# Patient Record
Sex: Female | Born: 1935
Health system: Southern US, Community
[De-identification: ages and names within clinical notes are randomized; demographics above are authoritative.]

## PROBLEM LIST (undated history)

## (undated) DIAGNOSIS — E785 Hyperlipidemia, unspecified: Secondary | ICD-10-CM

## (undated) DIAGNOSIS — M1990 Unspecified osteoarthritis, unspecified site: Secondary | ICD-10-CM

## (undated) DIAGNOSIS — K219 Gastro-esophageal reflux disease without esophagitis: Secondary | ICD-10-CM

## (undated) DIAGNOSIS — I10 Essential (primary) hypertension: Secondary | ICD-10-CM

## (undated) DIAGNOSIS — K222 Esophageal obstruction: Secondary | ICD-10-CM

## (undated) DIAGNOSIS — K589 Irritable bowel syndrome without diarrhea: Secondary | ICD-10-CM

## (undated) DIAGNOSIS — E119 Type 2 diabetes mellitus without complications: Secondary | ICD-10-CM

## (undated) DIAGNOSIS — K449 Diaphragmatic hernia without obstruction or gangrene: Secondary | ICD-10-CM

## (undated) HISTORY — DX: Hyperlipidemia, unspecified: E78.5

## (undated) HISTORY — DX: Diaphragmatic hernia without obstruction or gangrene: K44.9

## (undated) HISTORY — DX: Gastro-esophageal reflux disease without esophagitis: K21.9

## (undated) HISTORY — PX: COLONOSCOPY: SHX174

## (undated) HISTORY — DX: Irritable bowel syndrome, unspecified: K58.9

## (undated) HISTORY — PX: WRIST SURGERY: SHX841

## (undated) HISTORY — DX: Unspecified osteoarthritis, unspecified site: M19.90

## (undated) HISTORY — PX: TONSILLECTOMY: SUR1361

## (undated) HISTORY — DX: Essential (primary) hypertension: I10

## (undated) HISTORY — PX: ROTATOR CUFF REPAIR: SHX139

## (undated) HISTORY — DX: Type 2 diabetes mellitus without complications: E11.9

## (undated) HISTORY — PX: UPPER GASTROINTESTINAL ENDOSCOPY: SHX188

## (undated) HISTORY — DX: Esophageal obstruction: K22.2

---

## 1978-06-18 HISTORY — PX: TOTAL ABDOMINAL HYSTERECTOMY: SHX209

## 1998-08-17 ENCOUNTER — Other Ambulatory Visit: Admission: RE | Admit: 1998-08-17 | Discharge: 1998-08-17 | Payer: Self-pay | Admitting: *Deleted

## 1998-12-01 ENCOUNTER — Encounter: Payer: Self-pay | Admitting: *Deleted

## 1998-12-01 ENCOUNTER — Ambulatory Visit (HOSPITAL_COMMUNITY): Admission: RE | Admit: 1998-12-01 | Discharge: 1998-12-01 | Payer: Self-pay | Admitting: *Deleted

## 1999-12-05 ENCOUNTER — Ambulatory Visit (HOSPITAL_COMMUNITY): Admission: RE | Admit: 1999-12-05 | Discharge: 1999-12-05 | Payer: Self-pay | Admitting: *Deleted

## 1999-12-05 ENCOUNTER — Encounter: Payer: Self-pay | Admitting: *Deleted

## 2001-06-18 HISTORY — PX: VEIN LIGATION AND STRIPPING: SHX2653

## 2001-09-22 ENCOUNTER — Ambulatory Visit (HOSPITAL_COMMUNITY): Admission: RE | Admit: 2001-09-22 | Discharge: 2001-09-22 | Payer: Self-pay | Admitting: *Deleted

## 2001-09-22 ENCOUNTER — Encounter: Payer: Self-pay | Admitting: *Deleted

## 2001-10-08 ENCOUNTER — Other Ambulatory Visit: Admission: RE | Admit: 2001-10-08 | Discharge: 2001-10-08 | Payer: Self-pay | Admitting: *Deleted

## 2001-10-10 ENCOUNTER — Encounter: Payer: Self-pay | Admitting: *Deleted

## 2001-10-10 ENCOUNTER — Ambulatory Visit (HOSPITAL_COMMUNITY): Admission: RE | Admit: 2001-10-10 | Discharge: 2001-10-10 | Payer: Self-pay | Admitting: *Deleted

## 2001-12-01 ENCOUNTER — Encounter: Payer: Self-pay | Admitting: *Deleted

## 2001-12-03 ENCOUNTER — Ambulatory Visit (HOSPITAL_COMMUNITY): Admission: RE | Admit: 2001-12-03 | Discharge: 2001-12-04 | Payer: Self-pay | Admitting: *Deleted

## 2001-12-03 ENCOUNTER — Encounter (INDEPENDENT_AMBULATORY_CARE_PROVIDER_SITE_OTHER): Payer: Self-pay | Admitting: *Deleted

## 2002-11-26 ENCOUNTER — Encounter: Admission: RE | Admit: 2002-11-26 | Discharge: 2002-11-26 | Payer: Self-pay | Admitting: *Deleted

## 2002-11-26 ENCOUNTER — Encounter: Payer: Self-pay | Admitting: *Deleted

## 2004-11-27 ENCOUNTER — Encounter: Admission: RE | Admit: 2004-11-27 | Discharge: 2004-11-27 | Payer: Self-pay | Admitting: *Deleted

## 2004-12-04 ENCOUNTER — Encounter: Admission: RE | Admit: 2004-12-04 | Discharge: 2004-12-04 | Payer: Self-pay | Admitting: *Deleted

## 2006-02-15 ENCOUNTER — Encounter: Admission: RE | Admit: 2006-02-15 | Discharge: 2006-02-15 | Payer: Self-pay | Admitting: Internal Medicine

## 2006-06-06 ENCOUNTER — Encounter: Admission: RE | Admit: 2006-06-06 | Discharge: 2006-06-06 | Payer: Self-pay | Admitting: Internal Medicine

## 2006-09-08 ENCOUNTER — Emergency Department (HOSPITAL_COMMUNITY): Admission: EM | Admit: 2006-09-08 | Discharge: 2006-09-08 | Payer: Self-pay | Admitting: Emergency Medicine

## 2006-11-17 HISTORY — PX: KNEE ARTHROSCOPY: SHX127

## 2006-11-29 ENCOUNTER — Ambulatory Visit (HOSPITAL_COMMUNITY): Admission: RE | Admit: 2006-11-29 | Discharge: 2006-11-29 | Payer: Self-pay | Admitting: Orthopedic Surgery

## 2006-12-26 ENCOUNTER — Encounter: Admission: RE | Admit: 2006-12-26 | Discharge: 2007-03-26 | Payer: Self-pay | Admitting: Orthopedic Surgery

## 2007-11-13 ENCOUNTER — Encounter: Admission: RE | Admit: 2007-11-13 | Discharge: 2007-11-13 | Payer: Self-pay | Admitting: Internal Medicine

## 2008-07-28 ENCOUNTER — Telehealth (INDEPENDENT_AMBULATORY_CARE_PROVIDER_SITE_OTHER): Payer: Self-pay | Admitting: *Deleted

## 2008-07-29 ENCOUNTER — Encounter (INDEPENDENT_AMBULATORY_CARE_PROVIDER_SITE_OTHER): Payer: Self-pay | Admitting: *Deleted

## 2009-03-16 ENCOUNTER — Encounter: Admission: RE | Admit: 2009-03-16 | Discharge: 2009-03-16 | Payer: Self-pay | Admitting: Internal Medicine

## 2009-10-20 ENCOUNTER — Telehealth: Payer: Self-pay | Admitting: Internal Medicine

## 2010-03-20 ENCOUNTER — Encounter (INDEPENDENT_AMBULATORY_CARE_PROVIDER_SITE_OTHER): Payer: Self-pay | Admitting: *Deleted

## 2010-03-23 ENCOUNTER — Telehealth: Payer: Self-pay | Admitting: Internal Medicine

## 2010-03-27 ENCOUNTER — Encounter: Admission: RE | Admit: 2010-03-27 | Discharge: 2010-03-27 | Payer: Self-pay | Admitting: Internal Medicine

## 2010-03-31 ENCOUNTER — Encounter (INDEPENDENT_AMBULATORY_CARE_PROVIDER_SITE_OTHER): Payer: Self-pay | Admitting: *Deleted

## 2010-04-04 ENCOUNTER — Ambulatory Visit: Payer: Self-pay | Admitting: Internal Medicine

## 2010-04-10 ENCOUNTER — Ambulatory Visit: Payer: Self-pay | Admitting: Internal Medicine

## 2010-07-20 NOTE — Progress Notes (Signed)
Summary: Questions about pre-visit lettter  Phone Note Call from Patient Call back at Home Phone [phone]   Caller: Patient Call For: Dr. Juanda Chance Summary of Call: pt. has some questions about her pre-visit paperwork Initial call taken by: Karna Christmas,  March 23, 2010 9:28 AM  Follow-up for Phone Call        Patient would like to let us know that she did have some difficulty coming out of anesthesia for a general sugery. I have advised patient that typically, we use different sedatives than what would be used in a general surgery but that I would definately make the physician aware that she has had some problems before. I will let patient know if anything needs to be done differently, otherwise she is to keep her colonoscopy appointment as scheduled. Follow-up by: Lamona Curl CMA Duncan Dull),  March 23, 2010 12:33 PM  Additional Follow-up for Phone Call Additional follow up Details #1::        Left message with the pt that I agree with Dottie's assessment and that I will talk to the patient prior to the procedure so she can again raise her concerns. Additional Follow-up by: Hart Carwin MD,  March 24, 2010 4:07 PM

## 2010-07-20 NOTE — Letter (Signed)
Summary: Pre Visit Letter Revised  [facility]  [address] [place], [place] [address]   Phone: [phone]  Fax: [phone]        03/20/2010 MRN: [id number] [facility] [address] [place], [place]  [address]  [place]             Procedure Date:  04/10/2010  Welcome to the Gastroenterology Division at [facility].    You are scheduled to see a nurse for your pre-procedure visit on 04/04/2010 at 1:00pm on the 3rd floor at [facility], [address]  We ask that you try to arrive at our office 15 minutes prior to your appointment time to allow for check-in.  Please take a minute to review the attached form.  If you answer "Yes" to one or more of the questions on the first page, we ask that you call the person listed at your earliest opportunity.  If you answer "No" to all of the questions, please complete the rest of the form and bring it to your appointment.    Your nurse visit will consist of discussing your medical and surgical history, your immediate family medical history, and your medications.   If you are unable to list all of your medications on the form, please bring the medication bottles to your appointment and we will list them.  We will need to be aware of both prescribed and over the counter drugs.  We will need to know exact dosage information as well.    Please be prepared to read and sign documents such as consent forms, a financial agreement, and acknowledgement forms.  If necessary, and with your consent, a friend or relative is welcome to sit-in on the nurse visit with you.  Please bring your insurance card so that we may make a copy of it.  If your insurance requires a referral to see a specialist, please bring your referral form from your primary care physician.  No co-pay is required for this nurse visit.     If you cannot keep your appointment, please call [phone] to cancel or reschedule prior to your appointment date.   This allows [place] the opportunity to schedule an appointment for another patient in need of care.    Thank you for choosing  Gastroenterology for your medical needs.  We appreciate the opportunity to care for you.  Please visit [place] at our website  to learn more about our practice.  Sincerely, The Gastroenterology Division

## 2010-07-20 NOTE — Letter (Signed)
Summary: [facility] Instructions  [facility]  [address] [place], [place] [address]   Phone: [phone]  Fax: [phone]       Dominique Simmons    08/01/1933    MRN: [id number]        Procedure Day Dorna Bloom:  Duanne Limerick  04/10/10     Arrival Time:  3:00PM      Procedure Time:  4:00PM     Location of Procedure:                    _X _  [facility] (4th Floor)                       PREPARATION FOR COLONOSCOPY WITH MOVIPREP   Starting 5 days prior to your procedure 04/05/10 do not eat nuts, seeds, popcorn, corn, beans, peas,  salads, or any raw vegetables.  Do not take any fiber supplements (e.g. Metamucil, Citrucel, and Benefiber).  THE DAY BEFORE YOUR PROCEDURE         DATE: 04/09/10    DAY:  SUNDAY  1.  Drink clear liquids the entire day-NO SOLID FOOD  2.  Do not drink anything colored red or purple.  Avoid juices with pulp.  No orange juice.  3.  Drink at least 64 oz. (8 glasses) of fluid/clear liquids during the day to prevent dehydration and help the prep work efficiently.  CLEAR LIQUIDS INCLUDE: Water Jello Ice Popsicles Tea (sugar ok, no milk/cream) Powdered fruit flavored drinks Coffee (sugar ok, no milk/cream) Gatorade Juice: apple, white grape, white cranberry  Lemonade Clear bullion, consomm, broth Carbonated beverages (any kind) Strained chicken noodle soup Hard Candy                             4.  In the morning, mix first dose of MoviPrep solution:    Empty 1 Pouch A and 1 Pouch B into the disposable container    Add lukewarm drinking water to the top line of the container. Mix to dissolve    Refrigerate (mixed solution should be used within 24 hrs)  5.  Begin drinking the prep at 5:00 p.m. The MoviPrep container is divided by 4 marks.   Every 15 minutes drink the solution down to the next mark (approximately 8 oz) until the full liter is complete.   6.  Follow completed prep with 16 oz of clear liquid of your choice  (Nothing red or purple).  Continue to drink clear liquids until bedtime.  7.  Before going to bed, mix second dose of MoviPrep solution:    Empty 1 Pouch A and 1 Pouch B into the disposable container    Add lukewarm drinking water to the top line of the container. Mix to dissolve    Refrigerate  THE DAY OF YOUR PROCEDURE      DATE: 04/10/10  DAY:  MONDAY  Beginning at 11:00AM (5 hours before procedure):         1. Every 15 minutes, drink the solution down to the next mark (approx 8 oz) until the full liter is complete.  2. Follow completed prep with 16 oz. of clear liquid of your choice.    3. You may drink clear liquids until 2:00PM (2 HOURS BEFORE PROCEDURE).   MEDICATION INSTRUCTIONS  Unless otherwise instructed, you should take regular prescription medications with a small sip of water   as early  as possible the morning of your procedure.  Additional medication instructions: Hold HCTZ the morning of procedure.         OTHER INSTRUCTIONS  You will need a responsible adult at least 75 years of age to accompany you and drive you home.   This person must remain in the waiting room during your procedure.  Wear loose fitting clothing that is easily removed.  Leave jewelry and other valuables at home.  However, you may wish to bring a book to read or  an iPod/MP3 player to listen to music as you wait for your procedure to start.  Remove all body piercing jewelry and leave at home.  Total time from sign-in until discharge is approximately 2-3 hours.  You should go home directly after your procedure and rest.  You can resume normal activities the  day after your procedure.  The day of your procedure you should not:   Drive   Make legal decisions   Operate machinery   Drink alcohol   Return to work  You will receive specific instructions about eating, activities and medications before you leave.    The above instructions have been reviewed and explained to  me by   Wyona Almas RN  April 04, 2010 1:29 PM    I fully understand and can verbalize these instructions _____________________________ Date _________

## 2010-07-20 NOTE — Miscellaneous (Signed)
Summary: [facility] Previsit/prep  Clinical Lists Changes  Medications: Added new medication of MOVIPREP 100 GM  SOLR (PEG-KCL-NACL-NASULF-NA ASC-C) As per prep instructions. - Signed Rx of MOVIPREP 100 GM  SOLR (PEG-KCL-NACL-NASULF-NA ASC-C) As per prep instructions.;  #1 x 0;  Signed;  Entered by: Wyona Almas RN;  Authorized by: Hart Carwin MD;  Method used: Electronically to [facility] [address] [place], [place], [place]  [address], Ph: [phone], Fax: [phone] Allergies: Added new allergy or adverse reaction of AMOXICILLIN Observations: Added new observation of NKA: F (04/04/2010 12:51)    Prescriptions: MOVIPREP 100 GM  SOLR (PEG-KCL-NACL-NASULF-NA ASC-C) As per prep instructions.  #1 x 0   Entered by:   Wyona Almas RN   Authorized by:   Hart Carwin MD   Signed by:   Wyona Almas RN on 04/04/2010   Method used:   Electronically to        [facility] (retail)       [address]       [place], [place]  [address]       Ph: [phone]       Fax: [phone]   RxID:   [id number]

## 2010-07-20 NOTE — Progress Notes (Signed)
Summary: Schedule Colonoscopy  Phone Note Outgoing Call Call back at [facility] Phone [phone]   Call placed by: Harlow Mares CMA Duncan Dull),  Oct 20, 2009 12:42 PM Call placed to: Patient Summary of Call: spoke to patients husband and gave him our phone number and information to have the patient call back. He will have the patient call back to schedule when she gets home. She is due for a recall colonoscopy Initial call taken by: Harlow Mares CMA Duncan Dull),  Oct 20, 2009 12:43 PM

## 2010-07-20 NOTE — Procedures (Signed)
Summary: Colonoscopy  Patient: [name] Note: All result statuses are Final unless otherwise noted.  Tests: (1) Colonoscopy (COL)   COL Colonoscopy           DONE     [facility]     [address]     [place], [place]  [address]           COLONOSCOPY PROCEDURE REPORT           PATIENT:  Dominique, Simmons  MR#:  [id number]     BIRTHDATE:  03-05-1934, 76 yrs. old  GENDER:  female     ENDOSCOPIST:  Hedwig Morton. Juanda Chance, MD     REF. BY:  Georgianne Fick, M.D.     PROCEDURE DATE:  04/10/2010     PROCEDURE:  Colonoscopy 19147     ASA CLASS:  Class I     INDICATIONS:  Routine Risk Screening last colon 2000 was normal     MEDICATIONS:   Versed 8 mg, Fentanyl 75 mcg           DESCRIPTION OF PROCEDURE:   After the risks benefits and     alternatives of the procedure were thoroughly explained, informed     consent was obtained.  Digital rectal exam was performed and     revealed no rectal masses.   The LB CF-H180AL [id number] endoscope     was introduced through the anus and advanced to the cecum, which     was identified by both the appendix and ileocecal valve, without     limitations.  The quality of the prep was good, using MoviPrep.     The instrument was then slowly withdrawn as the colon was fully     examined.     <<PROCEDUREIMAGES>>           FINDINGS:  No polyps or cancers were seen (see image1, image2,     image3, image4, and image5).   Retroflexed views in the rectum     revealed no abnormalities.    The scope was then withdrawn from     the patient and the procedure completed.           COMPLICATIONS:  None     ENDOSCOPIC IMPRESSION:     1) No polyps or cancers     2) Normal colonoscopy     RECOMMENDATIONS:     1) high fiber diet     REPEAT EXAM:  In 0 year(s) for.  no recall due to age of 47 at     time of her recall           ______________________________     Hedwig Morton. Juanda Chance, MD           CC:           n.     eSIGNED:   Hedwig Morton. Brodie at 04/10/2010 04:40  PM           Anders Simmonds, [id number]  Note: An exclamation mark (!) indicates a result that was not dispersed into the flowsheet. Document Creation Date: 04/10/2010 4:40 PM _______________________________________________________________________  (1) Order result status: Final Collection or observation date-time: 04/10/2010 16:31 Requested date-time:  Receipt date-time:  Reported date-time:  Referring Physician:   Ordering Physician: Lina Sar [phone]) Specimen Source:  Source: Launa Grill Order Number: [id number] Lab site:

## 2010-10-31 NOTE — Op Note (Signed)
Dominique Simmons, Dominique Simmons               ACCOUNT NO.:  [id number]   MEDICAL RECORD NO.:  [id number]          PATIENT TYPE:  AMB   LOCATION:  DAY                          FACILITY:  [facility]   PHYSICIAN:  Ollen Gross, M.D.    DATE OF BIRTH:  [birth year]   DATE OF PROCEDURE:  11/29/2006  DATE OF DISCHARGE:                               OPERATIVE REPORT   PREOPERATIVE DIAGNOSIS:  Right knee medial lateral meniscal tears.   POSTOPERATIVE DIAGNOSIS:  Right knee medial lateral meniscal tears plus  chondral defect medial.   PROCEDURE:  Right knee arthroscopy with medial and lateral meniscal  debridement and chondroplasty, medial.   SURGEON:  Ollen Gross, M.D.   ASSISTANT:  No assistant.   ANESTHESIA:  General.   BLOOD LOSS:  Minimal.   DRAINS:  None.   COMPLICATIONS:  None.   CONDITION:  Stable to recovery.   BRIEF CLINICAL NOTE:  The patient is a 77 year old female with  significant right knee pain and mechanical symptoms.  Exam and history  consistent with meniscal tear as confirmed by MRI.  Presents now for  arthroscopy and debridement.   PROCEDURE IN DETAIL:  After successful administration of general  anesthetic, a tourniquet placed on the right thigh.  The right lower  extremity was prepped and draped in usual sterile fashion.  Standard  superomedial and inferolateral portal incision was made.  In-flow  cannula passed superomedial and camera passed inferolateral.  Arthroscopic visualization proceeds.  Undersurface of the patella and  trochlea looked normal.  A fair amount of synovitis present superiorly.  Medial and lateral gutters were visualized and there were no loose  bodies.  Flexion valgus force applied to the knee and the medial  compartment was entered.  She has extensive Grade II and III change on  the medial femoral condyle, and to a lesser extent on the medial tibial  plateau.  There is a tear in the body and posterior horn of the medial  meniscus.  Spinal needle  is used to localize the inferomedial portal.  Small incision made, dilator placed and then we debrided the meniscus  back to a stable base with baskets and a 4.2 mm shaver.  On the femoral  condyle, the unstable cartilage is debrided back to stable cartilaginous  base.  There was no exposed bone.  The cartilage was extremely thin.  On  the medial tibial plateau is a small area where we did the same.  The  medial compartment looked much better post debridement.  The  intercondylar notch is visualized, ACL was normal.  Lateral compartment  was entered.  She has a terrible tear on the anterior horn of the  lateral meniscus.  This is flipped into the joint.  This is debrided  back to stable base with baskets and 4.2 mm shaver and sealed off with  the ArthroCare device.  The chondral surfaces of the lateral compartment  have low grade chondromalacia.  The joint again inspected.  No further  tears or loose bodies.  Arthroscope was removed from the inferior  portals  which were  closed with  interrupted 4-0 nylon.  20 cc of 0.25% Marcaine with epi  injected through the in-flow cannula and that is removed and that portal  closed with nylon.  Bulky sterile dressings applied.  She is awakened  and transported to recovery in stable condition.      Ollen Gross, M.D.  Electronically Signed     FA/MEDQ  D:  11/29/2006  T:  11/29/2006  Job:  [id number]

## 2010-11-03 NOTE — Op Note (Signed)
[place]  Patient:    JOUA, BAKE Visit Number: [id number] MRN: [id number]          Service Type: DSU Location: [id number] Attending Physician:  Melvenia Needles Dictated by:   Denman George, M.D. Proc. Date: 12/03/01 Admit Date:  12/03/2001 Discharge Date: 12/04/2001                             Operative Report  SURGEON:  Denman George, M.D.  ASSISTANT:  Loura Pardon, P.A.  ANESTHETIC:  General endotracheal.  ANESTHESIOLOGIST:  PREOPERATIVE DIAGNOSES: 1. Bilateral greater saphenous vein incompetence. 2. Bilateral lower extremity varicose veins.  POSTOPERATIVE DIAGNOSES: 1. Bilateral greater saphenous vein incompetence. 2. Bilateral lower extremity varicose veins.  PROCEDURES: 1. Stripping of right greater saphenous vein. 2. High ligation of left greater saphenous vein. 3. Stripping and ligation of multiple venous varicosities.  CLINICAL NOTE:  This is a 77 year old female with recurrent varicose veins involving the lower extremities bilaterally.  She has previously had a ligation procedure along with ______ therapy injection.  She now has evidence by Doppler of bilateral greater saphenous vein incompetence and multiple varicosities bilaterally.  She has aching discomfort and swelling.  She is brought to the operating room at this time for bilateral lower extremity varicose vein stripping and ligation.  OPERATIVE PROCEDURE:  The patient was brought to the operating room in stable condition.  Placed in the supine position.  Both legs were prepped and draped in a sterile fashion.  Bilateral oblique groin skin incisions were made over the saphenofemoral junction.  Dissection was carried down bilaterally to expose the saphenofemoral junction.  Tributaries of the saphenous vein was ligated with 3-0 silk and divided.  In the left groin, a high ligation of the greater saphenous vein was carried out with 2-0 silk  tie.  In the right groin, the greater saphenous vein revealed marked varicose changes.  It was ligated at the saphenofemoral junction with 2-0 silk tie and encircled distally with 2-0 silk tie.  A second skin incision was then made in the right leg, the proximal calf over the saphenous vein.  The saphenous vein was exposed and ligated distally with 2-0 silk tie.  Divided, and the stripper could be passed to the mid thigh.  A counter incision was made at the stripper site in the mid thigh and the stripper was brought through and the distal portion of the saphenous vein was stripped.  A second stripper was then passed retrograde down the vein and the more proximal segment of the greater saphenous vein in the right thigh was removed.  Bilateral transverse stepladder skin incisions were then made over premarked sites and multiple varicosities were stripped through these and ligated with 2-0 and 3-0 silk.  The wound was then irrigated with saline solution.  The groin wounds were closed with running 3-0 Vicryl suture for subcutaneous layer and 4-0 subcuticular Vicryl for skin.  The small incisions in the leg were closed with a single layer of dermal 4-0 interrupted Vicryl.  Half-inch Steri-Strips were placed over these.  The patient tolerated the procedure well and was transferred to the recovery room with application of dressings of 4 x 4s, Kerlix, and Ace wrap. Dictated by:   Denman George, M.D. Attending Physician:  Melvenia Needles DD:  12/03/01 TD:  12/04/01 Job: [id number]

## 2010-11-03 NOTE — Discharge Summary (Signed)
[place]  Patient:    Dominique Simmons, Dominique Simmons Visit Number: [id number] MRN: [id number]          Service Type: DSU Location: [id number] Attending Physician:  Melvenia Needles Dictated by:   Loura Pardon, P.A. Admit Date:  12/03/2001 Discharge Date: 12/04/2001   CC:         Andres Ege, M.D. [address] [place], [place]                           Discharge Summary  DATE OF BIRTH:  26-Aug-1933  DISCHARGE DIAGNOSES:  Bilateral lower extremity varicose veins, progressive, painful.  SECONDARY DIAGNOSES 1. Hypercholesterolemia. 2. Irritable bowel syndrome. 3. Status post total abdominal hysterectomy. 4. Status post tendon repair of left wrist. 5. Status post left shoulder surgery secondary to adhesive capsulitis. 7. Status post tonsillectomy.  DISCHARGE DISPOSITION:  The patient is ready for discharge on postoperative day #1 after undergoing ligature and stripping of varicose veins in the lower extremities, including stripping of the greater saphenous vein in the right thigh and ligation of the greater saphenous vein in the left thigh. The patient has been afebrile in the postoperative period. Her vital signs have been stable. Her pain is controlled well with oral Percocet. She is taking oral nourishment well and tolerating it. Her incisions are showing no evidence of erythema, swelling or drainage. She is able to ambulate independently, although she has been maintained on bed rest for the majority of the day of surgery. Her mental status has remained clear, and she is ready for discharge on the morning of postoperative day #1.  DISCHARGE MEDICATIONS:  Percocet 5/325 1 to 2 tabs p.o. q.4-6h. p.r.n. pain.  DISCHARGE INSTRUCTIONS:  Activity, she may begin walking Thursday, December 04, 2001. She is asked to keep her legs elevated on pillows when in bed or sitting in a chair. She may remove the bandages to the lower  extremities on the evening of Thursday, December 04, 2001. She is to replace the Ace bandages only on both legs, Friday morning, December 05, 2001. She may begin showering on Saturday, December 06, 2001. Her discharge diet is a regular diet without restrictions.  FOLLOWUP:  She will see Dr. Madilyn Fireman in the office for followup Monday, December 08, 2001, at 11:30 in the morning.  CONDITION ON DISCHARGE:  Improved.  BRIEF HISTORY:  The patient is a 77 year old female. She underwent local varicose vein surgery in 1998 by Dr. Lebron Conners. Following the surgery she did develop a hematoma in the right leg which required postoperative evacuation. She complains now of discomfort in the right leg associated with progressive varicose veins. She notes that they are aching, heavy and cause fatigue. The varicosities on the left leg are symptomatic, but to a lesser degree. She has no history of phlebitis or prior anticoagulant therapy.  On Nov 13, 2001, she underwent Doppler venous examination of the left and right lower extremities. The study showed no evidence of deep venous thrombosis bilaterally. The study does show incompetence of the right saphenofemoral junction, incompetence in the right greater saphenous vein and also in the lesser saphenous vein in the midcalf to ankle. She also has incompetence of the left greater saphenous vein beginning in the upper thigh with incompetent branches to the lateral popliteal fossa. Also it was found old superficial thrombophlebitis in a varicose vein at the medial aspect of the right  knee. She presents on December 03, 2001, for ligature and stripping of varicose veins bilaterally.  HOSPITAL COURSE:  As described in the discharge disposition. The patient was discharged on postoperative day #1 in stable and satisfactory condition with followup with Dr. Madilyn Fireman on Monday, December 08, 2001.          Dictated by:   Loura Pardon, P.A. Attending Physician:  Melvenia Needles DD:   12/03/01 TD:  12/04/01 Job: [id number]

## 2010-11-03 NOTE — H&P (Signed)
[place]  Patient:    Dominique Simmons, KEYS Visit Number: [id number] MRN: [id number]          Service Type: DSU Location: [phone] Attending Physician:  Melvenia Needles Dictated by:   Adair Patter, P.A. Admit Date:  12/03/2001                           History and Physical  CHIEF COMPLAINT:  Bilateral varicose veins.  HISTORY OF PRESENT ILLNESS:  This is a 77 year old female who has had varicose veins for almost her entire life.  She said she has had trouble with them for most of her life but recently these varicose veins have become painful and have significantly affected her lifestyle.  She denies any nonhealing ulcers but says that these varicose veins cause pain that is almost constant in nature and is preventing her from progressing with her activities of daily living.  PAST MEDICAL HISTORY: 1. Hypercholesterolemia. 2. Irritable bowel syndrome.  PAST SURGICAL HISTORY: 1. Total abdominal hysterectomy. 2. Tendon repair of the left wrist. 3. Left shoulder surgery secondary to adhesive capsulitis. 4. Tonsillectomy.  ALLERGIES:  AMOXICILLIN, which causes hives.  MEDICATIONS:  The patient currently takes no medication.  FAMILY HISTORY:  The patients mother and father are deceased.  They both had coronary artery disease.  She has said she has an extensive family history of multiple types of cancer.  She has a sister who is living who has coronary artery disease and had a myocardial infarction.  She has a brother who has diabetes mellitus.  She denies any family history of hypertension or cerebrovascular accident.  SOCIAL HISTORY:  The patient is married and lives with her husband.  Has two children.  She denies any alcohol use and has never smoked cigarettes.  REVIEW OF SYSTEMS:  GENERAL:  The patient denies any fever, chills, night sweats, or frequent illnesses.  HEAD:  The patient denies any severe head injuries or loss of  consciousness.  EYES:  The patient denies any glaucoma or cataracts.  EARS:  The patient denies any tinnitus, vertigo, hearing loss, or ear infections.  NOSE:  The patient denies any epistaxis, rhinorrhea, or sinusitis.  MOUTH:  The patient denies any problems with dentition or frequent sore throats.  NECK:  The patient denies any lumps, masses, or pain on range of motion of her neck.  CARDIOVASCULAR:  The patient denies any hypertension, cardiac arrhythmias, or myocardial infarction.  PULMONARY:  The patient denies any asthma, bronchitis, emphysema, or pneumonia.  GASTROINTESTINAL:  The patient has irritable bowel syndrome but does not currently take any treatment for that.  She denies any gastroesophageal reflux disease.  GENITOURINARY:  Tp denies any urinary tract infections or incontinence.  ENDOCRINE:  The patient denies any diabetes mellitus or thyroid disease.  MUSCULOSKELETAL:  The patient denies any arthritis, arthralgias, or myalgias.  NEUROLOGIC:  The patient denies any memory loss, depression, stroke, or transient ischemic attack.  PHYSICAL EXAMINATION:  VITAL SIGNS:  Blood pressure is 120/80, pulse is 84 and regular, respirations are 16.  GENERAL:  The patient is alert and oriented x3, is in no distress.  HEENT:  Head is atraumatic, normocephalic.  Eyes:  The pupils equal, round and reactive to light and accommodation, extraocular movements are intact, without scleral icterus or nystagmus.  Ears:  Auditory acuity is grossly intact. Noses:  The nares are patent and intact.  Sinuses are nontender.  Mouth is moist without exudates.  NECK:  Supple without JVD, lymphadenopathy, carotid bruits, or thyromegaly.  CHEST:  Bilaterally clear to auscultation, without rhonchi or wheezes.  CARDIAC:  Regular rate and rhythm without murmurs, gallops, or rubs.  ABDOMEN:  Soft, nontender, nondistended.  Positive bowel sounds in all four quadrants.  EXTREMITIES:  No cyanosis,  clubbing, or edema.  Her feet were warm to touch. She had multiple varicose veins present in her lower extremities.  VASCULAR:  Peripheral pulse exam revealed 2+ carotid, femoral, and 1+ dorsalis pedis pulses bilaterally.  NEUROLOGIC:  Cranial nerves II-XII were grossly intact.  Her gait was steady without the use of any assistive devices.  She had 5+ and equal muscle strength in all four extremities.  IMPRESSION:  Bilateral varicose veins.  PLAN:  The patient will undergo varicose vein stripping of the bilateral lower extremities. Dictated by:   Adair Patter, P.A. Attending Physician:  Melvenia Needles DD:  12/01/01 TD:  12/02/01 Job: [id number]

## 2011-04-05 LAB — BASIC METABOLIC PANEL
CO2: 29
Creatinine, Ser: 0.81
GFR calc Af Amer: >60
Glucose, Bld: 135 — ABNORMAL HIGH
Potassium: 3.4 — ABNORMAL LOW
Sodium: 140

## 2011-04-05 LAB — CBC
HCT: 42.3
MCHC: 34.8
Platelets: 210
RBC: 4.67
RDW: 12.4

## 2011-04-05 LAB — URINALYSIS, ROUTINE W REFLEX MICROSCOPIC
Bilirubin Urine: NEGATIVE
Ketones, ur: NEGATIVE
Nitrite: NEGATIVE
Protein, ur: NEGATIVE
Urobilinogen, UA: 0.2
pH: 7

## 2011-04-05 LAB — URINE MICROSCOPIC-ADD ON

## 2011-09-12 ENCOUNTER — Ambulatory Visit
Admission: RE | Admit: 2011-09-12 | Discharge: 2011-09-12 | Disposition: A | Discharge status: Home / Self Care | Payer: Medicare Other | Source: Ambulatory Visit | Attending: Internal Medicine | Admitting: Internal Medicine

## 2011-09-12 ENCOUNTER — Other Ambulatory Visit: Payer: Self-pay | Admitting: Internal Medicine

## 2011-09-12 DIAGNOSIS — R29810 Facial weakness: Secondary | ICD-10-CM

## 2011-11-17 DIAGNOSIS — K222 Esophageal obstruction: Secondary | ICD-10-CM

## 2011-11-17 DIAGNOSIS — K449 Diaphragmatic hernia without obstruction or gangrene: Secondary | ICD-10-CM

## 2011-11-17 HISTORY — DX: Diaphragmatic hernia without obstruction or gangrene: K44.9

## 2011-11-17 HISTORY — DX: Esophageal obstruction: K22.2

## 2011-11-30 ENCOUNTER — Other Ambulatory Visit: Payer: Self-pay | Admitting: Internal Medicine

## 2011-11-30 DIAGNOSIS — Z1231 Encounter for screening mammogram for malignant neoplasm of breast: Secondary | ICD-10-CM

## 2011-12-05 ENCOUNTER — Other Ambulatory Visit: Payer: Self-pay | Admitting: Otolaryngology

## 2011-12-05 DIAGNOSIS — R1310 Dysphagia, unspecified: Secondary | ICD-10-CM

## 2011-12-11 ENCOUNTER — Ambulatory Visit
Admission: RE | Admit: 2011-12-11 | Discharge: 2011-12-11 | Disposition: A | Discharge status: Home / Self Care | Payer: Medicare Other | Source: Ambulatory Visit | Attending: Otolaryngology | Admitting: Otolaryngology

## 2011-12-11 DIAGNOSIS — R1310 Dysphagia, unspecified: Secondary | ICD-10-CM

## 2011-12-25 ENCOUNTER — Encounter: Payer: Self-pay | Admitting: Internal Medicine

## 2011-12-26 ENCOUNTER — Ambulatory Visit
Admission: RE | Admit: 2011-12-26 | Discharge: 2011-12-26 | Disposition: A | Discharge status: Home / Self Care | Payer: Medicare Other | Source: Ambulatory Visit | Attending: Internal Medicine | Admitting: Internal Medicine

## 2011-12-26 DIAGNOSIS — Z1231 Encounter for screening mammogram for malignant neoplasm of breast: Secondary | ICD-10-CM

## 2012-01-22 ENCOUNTER — Encounter: Payer: Self-pay | Admitting: *Deleted

## 2012-01-23 ENCOUNTER — Encounter: Payer: Self-pay | Admitting: *Deleted

## 2012-02-05 ENCOUNTER — Ambulatory Visit (INDEPENDENT_AMBULATORY_CARE_PROVIDER_SITE_OTHER): Payer: BC Managed Care – PPO | Admitting: Internal Medicine

## 2012-02-05 ENCOUNTER — Encounter: Payer: Self-pay | Admitting: Internal Medicine

## 2012-02-05 VITALS — BP 110/60 | HR 68 | Ht 66.25 in | Wt 172.2 lb

## 2012-02-05 DIAGNOSIS — Q393 Congenital stenosis and stricture of esophagus: Secondary | ICD-10-CM

## 2012-02-05 DIAGNOSIS — R1319 Other dysphagia: Secondary | ICD-10-CM

## 2012-02-05 DIAGNOSIS — K222 Esophageal obstruction: Secondary | ICD-10-CM

## 2012-02-05 NOTE — Patient Instructions (Addendum)
You have been scheduled for an endoscopy with propofol. Please follow written instructions given to you at your visit today. If you use inhalers (even only as needed), please bring them with you on the day of your procedure. CC: Dr Georgianne Fick, Dr Brynda Peon

## 2012-02-05 NOTE — Progress Notes (Signed)
Dominique Simmons 07-24-1933 MRN [id number]   History of Present Illness:  This is a 78 year old white female with episodes of solid food dysphagia and food regurgitation. A recent episode in a restaurant was a very scary for her. She was unable to swallow her saliva for several hours and ultimately regurgitated all the retained food back. This has been occurring for several years intermittently. Her barium swallow recently showed a 8-9 mm narrowing in the distal esophagus which was consistent with a Schatzki's ring. A 13 mm barium tablet did not pass. There was a small hiatal hernia and normal esophageal motility. We have seen Ms.Topping in the past for a colorectal screening. She had a normal flexible sigmoidoscopy in 1990 and 1997. She had also a normal colonoscopy in March 2000 and in October 2011.   Past Medical History  Diagnosis Date  . Sliding hiatal hernia 11/2011  . Esophageal ring 11/2011  . IBS (irritable bowel syndrome)   . Hyperlipidemia   . GERD (gastroesophageal reflux disease)   . Arthritis   . Hypertension    Past Surgical History  Procedure Date  . Total abdominal hysterectomy 1980  . Wrist surgery     left  . Rotator cuff repair     left  . Tonsillectomy   . Vein ligation and stripping 2003    bilateral  . Knee arthroscopy 11/2006    right    reports that she has never smoked. She has never used smokeless tobacco. She reports that she does not drink alcohol or use illicit drugs. family history includes Coronary artery disease in her father, mother, and sister; Diabetes in her brother; and Heart attack in her sister. Allergies  Allergen Reactions  . Amoxicillin     REACTION: rash        Review of Systems: Positive for solid food dysphagia. Denies odynophagia. Shortness of breath change in bowel habits  The remainder of the 10 point ROS is negative except as outlined in H&P   Physical Exam: General appearance  Well developed, in no distress. Eyes- non  icteric. HEENT nontraumatic, normocephalic. Mouth no lesions, tongue papillated, no cheilosis normal voice. Neck supple without adenopathy, thyroid not enlarged, no carotid bruits, no JVD. Lungs Clear to auscultation bilaterally. Cor normal S1, normal S2, regular rhythm, no murmur,  quiet precordium. Abdomen: Soft nontender abdomen with normoactive bowel sounds. Rectal: Not done. Extremities no pedal edema. Skin no lesions. Neurological alert and oriented x 3. Psychological normal mood and affect.  Assessment and Plan:  Problem #79 78 year old white female with a Schatzki's ring on a barium esophagram which I have personally reviewed via DVD. The stricture appears benign. It has a maximum diameter 9 mm. Her motility is normal. She will be scheduled for an upper endoscopy and esophageal dilation. She does not give any history of symptomatic gastroesophageal reflux. This stricture is probably not caused by acid reflux.  Problem #2 Colorectal screening. She is up-to-date on her colonoscopy. Her next exam is due in October 2021. However, it probably will not be necessary because of her age at that time.   02/05/2012 Lina Sar

## 2012-02-07 ENCOUNTER — Encounter: Payer: Self-pay | Admitting: Internal Medicine

## 2012-02-07 ENCOUNTER — Ambulatory Visit (AMBULATORY_SURGERY_CENTER): Payer: BC Managed Care – PPO | Admitting: Internal Medicine

## 2012-02-07 VITALS — BP 136/65 | HR 72 | Temp 97.9°F | Resp 16 | Ht 66.25 in | Wt 172.0 lb

## 2012-02-07 DIAGNOSIS — R1319 Other dysphagia: Secondary | ICD-10-CM

## 2012-02-07 DIAGNOSIS — K222 Esophageal obstruction: Secondary | ICD-10-CM

## 2012-02-07 MED ORDER — SODIUM CHLORIDE 0.9 % IV SOLN: 500.0000 mL | INTRAVENOUS | Status: DC

## 2012-02-07 NOTE — Progress Notes (Signed)
Patient did not experience any of the following events: a burn prior to discharge; a fall within the facility; wrong site/side/patient/procedure/implant event; or a hospital transfer or hospital admission upon discharge from the facility. (G8907) Patient did not have preoperative order for IV antibiotic SSI prophylaxis. (G8918)  

## 2012-02-07 NOTE — Op Note (Signed)
[facility] [address] [place], [address]   ENDOSCOPY PROCEDURE REPORT  PATIENT: Dominique Simmons, Dominique Simmons  MR#: [id number] BIRTHDATE: 03/29/1934 , 78  yrs. old GENDER: Female ENDOSCOPIST: Hart Carwin, MD REFERRED BY:  Georgianne Fick, M.D. PROCEDURE DATE:  02/07/2012 PROCEDURE:  EGD w/ dilation of esophagus via guidewire ASA CLASS:     Class II INDICATIONS:  dysphagia.   ba rium esophagram showa distal es. stricture, 13 mm tablet could not pass through. MEDICATIONS: MAC sedation, administered by CRNA and Propofol (Diprivan) 130 mg IV TOPICAL ANESTHETIC: Cetacaine Spray  DESCRIPTION OF PROCEDURE: After the risks benefits and alternatives of the procedure were thoroughly explained, informed consent was obtained.  The LB-GIF Q180 [id number] endoscope was introduced through the mouth and advanced to the second portion of the duodenum. Without limitations.  The instrument was slowly withdrawn as the mucosa was fully examined.      ESOPHAGUS: A stricture was found at the gastroesophageal junction. The stenosis was traversable with the endoscope.   A 3 cm hiatal hernia was noted.  Benign appearing distal esophageal stricture less than 9 mm, traversed with resistance, there was bleeding from the stricture stricture dilated with Savary dilators 12 mm and 13 mm with samll amount of blood on the dilator.   Non-reducible hernia, 32-35 cm. Retroflexed views revealed no abnormalities.     The scope was then withdrawn from the patient and the procedure completed.  COMPLICATIONS: There were no complications. ENDOSCOPIC IMPRESSION: 1.   Stricture was found at the gastroesophageal junction 2.   3 cm hiatal hernia 3.   Benign appearing distal esophageal stricture less than 9 mm, traversed with resistance, there was bleeding from the stricture stricture dilated with Savary dilators 12 mm and 13 mm with samll amount of blood on the dilator 4.   Non-reducible hernia, 32-35  cm  RECOMMENDATIONS: 1.  Anti-reflux regimen to be follow 2.  continue current meds  REPEAT EXAM: In 4 week(s)  for EGD with redilatation,. due to severity of the stricture  eSigned:  Hart Carwin, MD 02/07/2012 11:43 AM   CC:

## 2012-02-07 NOTE — Patient Instructions (Addendum)
YOU HAD AN ENDOSCOPIC PROCEDURE TODAY AT THE [facility]: Refer to the procedure report that was given to you for any specific questions about what was found during the examination.  If the procedure report does not answer your questions, please call your gastroenterologist to clarify.  If you requested that your care partner not be given the details of your procedure findings, then the procedure report has been included in a sealed envelope for you to review at your convenience later.  YOU SHOULD EXPECT: Some feelings of bloating in the abdomen. Passage of more gas than usual.  Walking can help get rid of the air that was put into your GI tract during the procedure and reduce the bloating. If you had a lower endoscopy (such as a colonoscopy or flexible sigmoidoscopy) you may notice spotting of blood in your stool or on the toilet paper. If you underwent a bowel prep for your procedure, then you may not have a normal bowel movement for a few days.  DIET: FOLLOW DILATION DIET! Drink plenty of fluids but you should avoid alcoholic beverages for 24 hours.  ACTIVITY: Your care partner should take you home directly after the procedure.  You should plan to take it easy, moving slowly for the rest of the day.  You can resume normal activity the day after the procedure however you should NOT DRIVE or use heavy machinery for 24 hours (because of the sedation medicines used during the test).    SYMPTOMS TO REPORT IMMEDIATELY: A gastroenterologist can be reached at any hour.  During normal business hours, 8:30 AM to 5:00 PM Monday through Friday, call [phone].  After hours and on weekends, please call the GI answering service at [phone] who will take a message and have the physician on call contact you.   Following upper endoscopy (EGD)  Vomiting of blood or coffee ground material  New chest pain or pain under the shoulder blades  Painful or persistently difficult swallowing  New  shortness of breath  Fever of 100F or higher  Black, tarry-looking stools  FOLLOW UP: If any biopsies were taken you will be contacted by phone or by letter within the next 1-3 weeks.  Call your gastroenterologist if you have not heard about the biopsies in 3 weeks.  Our staff will call the home number listed on your records the next business day following your procedure to check on you and address any questions or concerns that you may have at that time regarding the information given to you following your procedure. This is a courtesy call and so if there is no answer at the home number and we have not heard from you through the emergency physician on call, we will assume that you have returned to your regular daily activities without incident.  SIGNATURES/CONFIDENTIALITY: You and/or your care partner have signed paperwork which will be entered into your electronic medical record.  These signatures attest to the fact that that the information above on your After Visit Summary has been reviewed and is understood.  Full responsibility of the confidentiality of this discharge information lies with you and/or your care-partner.   Continue current medications  Arrive 15 minutes early to your Previsit- go the third floor and bring your insurance card

## 2012-02-08 ENCOUNTER — Telehealth: Payer: Self-pay

## 2012-02-08 NOTE — Telephone Encounter (Signed)
  Follow up Call-  Call back number 02/07/2012  Post procedure Call Back phone  # [phone]  Permission to leave phone message Yes     Patient questions:  Do you have a fever, pain , or abdominal swelling? no Pain Score  0 *  Have you tolerated food without any problems? yes  Have you been able to return to your normal activities? yes  Do you have any questions about your discharge instructions: Diet   no Medications  no Follow up visit  no  Do you have questions or concerns about your Care? no  Actions: * If pain score is 4 or above: No action needed, pain <4.

## 2012-03-13 ENCOUNTER — Ambulatory Visit (AMBULATORY_SURGERY_CENTER): Payer: BC Managed Care – PPO | Admitting: *Deleted

## 2012-03-13 VITALS — Ht 66.5 in | Wt 173.9 lb

## 2012-03-13 DIAGNOSIS — R1319 Other dysphagia: Secondary | ICD-10-CM

## 2012-03-13 DIAGNOSIS — K222 Esophageal obstruction: Secondary | ICD-10-CM

## 2012-03-14 ENCOUNTER — Encounter: Payer: Self-pay | Admitting: Internal Medicine

## 2012-03-14 ENCOUNTER — Ambulatory Visit (AMBULATORY_SURGERY_CENTER): Payer: BC Managed Care – PPO | Admitting: Internal Medicine

## 2012-03-14 VITALS — BP 140/66 | HR 67 | Temp 97.4°F | Resp 20 | Ht 66.5 in | Wt 172.9 lb

## 2012-03-14 DIAGNOSIS — R1310 Dysphagia, unspecified: Secondary | ICD-10-CM

## 2012-03-14 DIAGNOSIS — K222 Esophageal obstruction: Secondary | ICD-10-CM

## 2012-03-14 MED ORDER — SODIUM CHLORIDE 0.9 % IV SOLN: 500.0000 mL | INTRAVENOUS | Status: DC

## 2012-03-14 NOTE — Op Note (Signed)
[facility] [address] [place], [address]   ENDOSCOPY PROCEDURE REPORT  PATIENT: Dominique, Simmons  MR#: [id number] BIRTHDATE: October 01, 1933 , 78  yrs. old GENDER: Female ENDOSCOPIST: Hart Carwin, MD REFERRED AO:[id number] Nicholos Johns, M.D. PROCEDURE DATE:  03/14/2012 PROCEDURE:   EGD with dilatation over guidewire ASA CLASS:   Class II INDICATIONS:dysphagia and abnormal UGI- 13 mm tablet did not pass, , 9 mm stricture. MEDICATIONS: MAC sedation, administered by CRNA and Propofol (Diprivan) 140 mg IV TOPICAL ANESTHETIC:   Cetacaine Spray  DESCRIPTION OF PROCEDURE:   After the risks benefits and alternatives of the procedure were thoroughly explained, informed consent was obtained.  The   endoscope was introduced through the mouth  and advanced to the second portion of the duodenum , The instrument was slowly withdrawn as the mucosa was carefully examined.      ESOPHAGUS: 9 mm scope initially could not traverse but with mild pressure it "popped" through, there was a small amount of bleeding from the stricture after the scope passed through Stricture was found in the lower third of the esophagus. Guige wire was placed without fluoroscopic guidance and 12,13,14 and 15 mm dilators passed with mild resistance, small amount of blood on the dilator The stenosis was traversable with the endoscope.  12,13,14,15 mm  COMPLICATIONS: There were no complications. ENDOSCOPIC IMPRESSION: Stricture was found in the lower third of the esophagus  benign appearing 9 mm moderately severe stricture, dilated with 12,13,14,and 15 mm dilators , small hiatal henia present  RECOMMENDATIONS: 1.  anti-reflux regimen to be follow 2.  continue PPI 3  .redilation in the future prn   eSigned:  Hart Carwin, MD 03/14/2012 10:36 AM  CC:  PATIENT NAME:  Dominique, Simmons MR#: [id number]

## 2012-03-14 NOTE — Patient Instructions (Addendum)
YOU HAD AN ENDOSCOPIC PROCEDURE TODAY AT THE [facility]: Refer to the procedure report that was given to you for any specific questions about what was found during the examination.  If the procedure report does not answer your questions, please call your gastroenterologist to clarify.  If you requested that your care partner not be given the details of your procedure findings, then the procedure report has been included in a sealed envelope for you to review at your convenience later.  YOU SHOULD EXPECT: Some feelings of bloating in the abdomen. Passage of more gas than usual.  Walking can help get rid of the air that was put into your GI tract during the procedure and reduce the bloating. If you had a lower endoscopy (such as a colonoscopy or flexible sigmoidoscopy) you may notice spotting of blood in your stool or on the toilet paper. If you underwent a bowel prep for your procedure, then you may not have a normal bowel movement for a few days.  DIET: Follow Post Dilation Diet Sheet Given.   ACTIVITY: Your care partner should take you home directly after the procedure.  You should plan to take it easy, moving slowly for the rest of the day.  You can resume normal activity the day after the procedure however you should NOT DRIVE or use heavy machinery for 24 hours (because of the sedation medicines used during the test).    SYMPTOMS TO REPORT IMMEDIATELY: A gastroenterologist can be reached at any hour.  During normal business hours, 8:30 AM to 5:00 PM Monday through Friday, call [phone].  After hours and on weekends, please call the GI answering service at [phone] who will take a message and have the physician on call contact you.    Following upper endoscopy (EGD)  Vomiting of blood or coffee ground material  New chest pain or pain under the shoulder blades  Painful or persistently difficult swallowing  New shortness of breath  Fever of 100F or higher  Black,  tarry-looking stools  FOLLOW UP: If any biopsies were taken you will be contacted by phone or by letter within the next 1-3 weeks.  Call your gastroenterologist if you have not heard about the biopsies in 3 weeks.  Our staff will call the home number listed on your records the next business day following your procedure to check on you and address any questions or concerns that you may have at that time regarding the information given to you following your procedure. This is a courtesy call and so if there is no answer at the home number and we have not heard from you through the emergency physician on call, we will assume that you have returned to your regular daily activities without incident.  SIGNATURES/CONFIDENTIALITY: You and/or your care partner have signed paperwork which will be entered into your electronic medical record.  These signatures attest to the fact that that the information above on your After Visit Summary has been reviewed and is understood.  Full responsibility of the confidentiality of this discharge information lies with you and/or your care-partner.

## 2012-03-14 NOTE — Progress Notes (Signed)
Patient did not experience any of the following events: a burn prior to discharge; a fall within the facility; wrong site/side/patient/procedure/implant event; or a hospital transfer or hospital admission upon discharge from the facility. (G8907) Patient did not have preoperative order for IV antibiotic SSI prophylaxis. (G8918)  

## 2012-03-17 ENCOUNTER — Telehealth: Payer: Self-pay | Admitting: *Deleted

## 2012-03-17 NOTE — Telephone Encounter (Signed)
  Follow up Call-  Call back number 03/14/2012 02/07/2012  Post procedure Call Back phone  # [phone]  Permission to leave phone message Yes Yes     Patient questions:  Do you have a fever, pain , or abdominal swelling? no Pain Score  0 *  Have you tolerated food without any problems? yes  Have you been able to return to your normal activities? yes  Do you have any questions about your discharge instructions: Diet   no Medications  no Follow up visit  no  Do you have questions or concerns about your Care? no  Actions: * If pain score is 4 or above: No action needed, pain <4.

## 2012-03-20 ENCOUNTER — Encounter: Payer: Medicare Other | Admitting: Internal Medicine

## 2014-01-30 IMAGING — RF DG ESOPHAGUS
14 of 17 series · 19 of 24 positions shown · non-contrast
Comparison: None.

CLINICAL DATA: Dysphagia.

ESOPHOGRAM/BARIUM SWALLOW
TECHNIQUE: Combined double contrast and single contrast
examination performed using effervescent crystals, thick barium
liquid, and thin barium liquid.
Fluoroscopy time:  1.2 minutes.

[Series 1: run · 3 of 9 slices shown (1 of 14)]
[im 1/9]
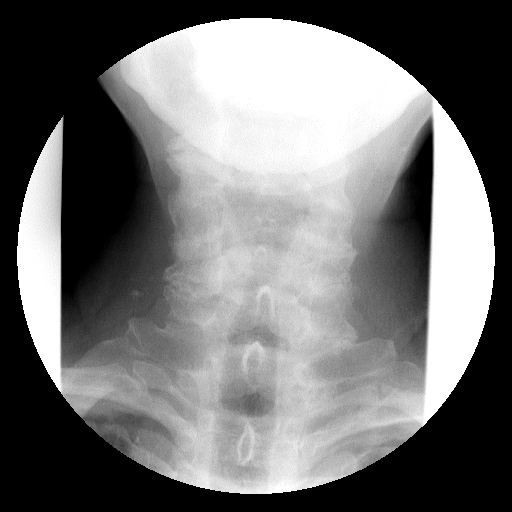
[im 3/9]
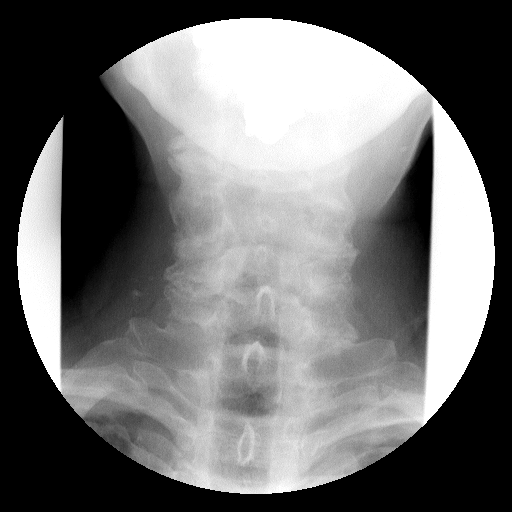
[im 9/9]
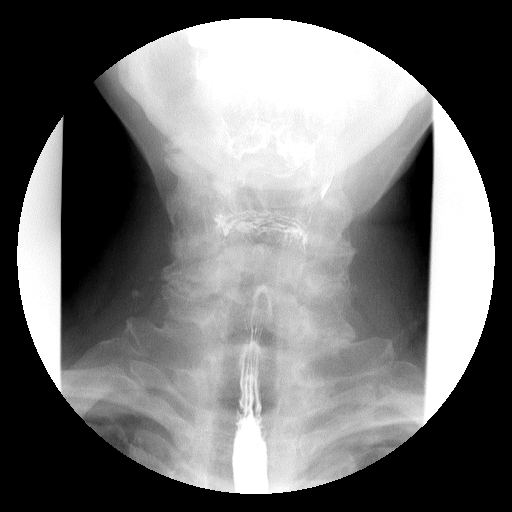

[Series 2: run · 3 of 7 slices shown (2 of 14)]
[im 1/7]
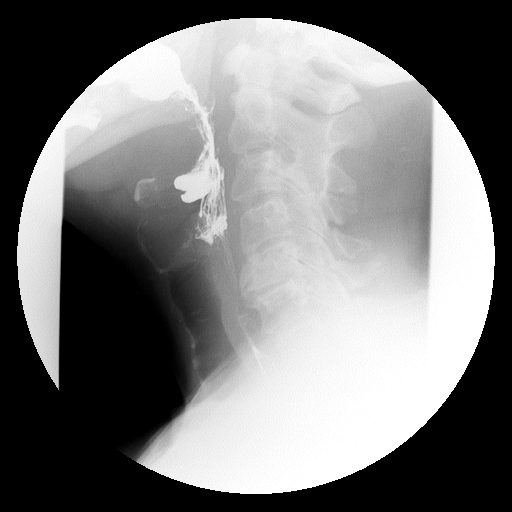
[im 3/7]
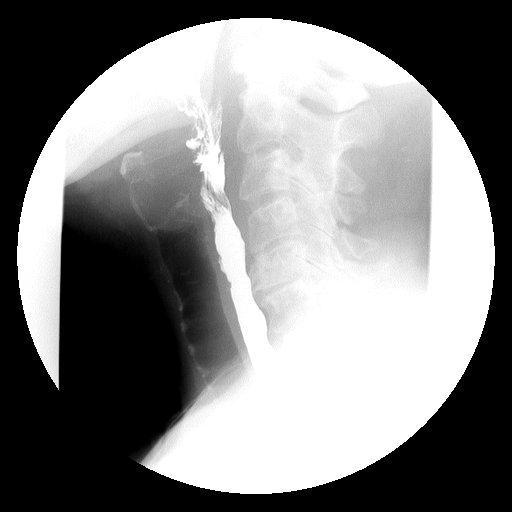
[im 5/7]
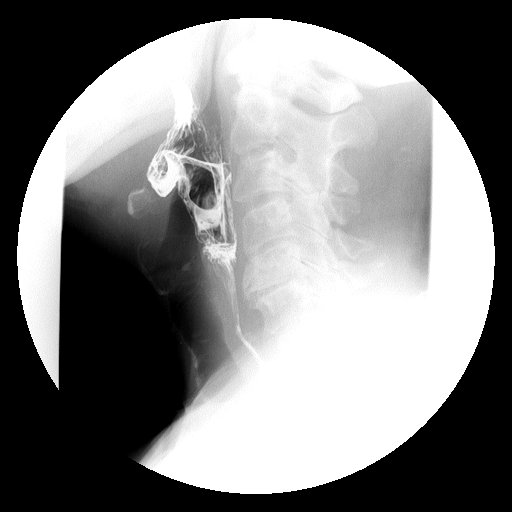

[Series 3: run · 2 of 4 slices shown (3 of 14)]
[im 1/4]
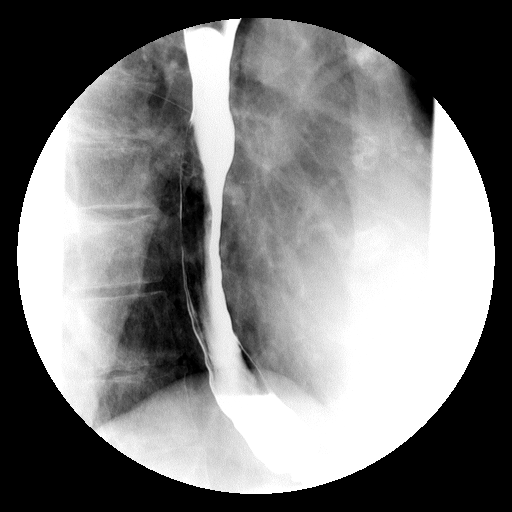
[im 4/4]
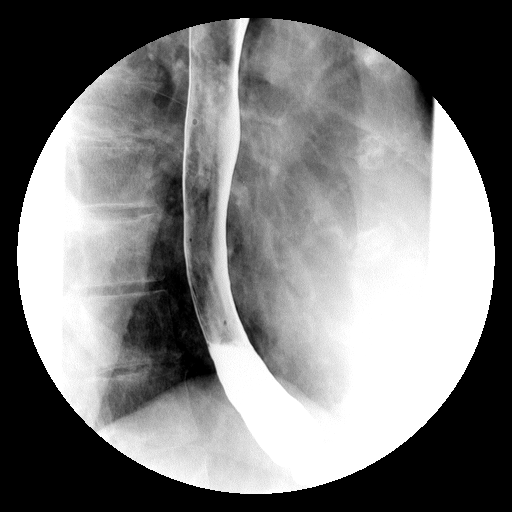

[Series 4: run · 1 of 3 slices shown (4 of 14)]
[im 1/3]
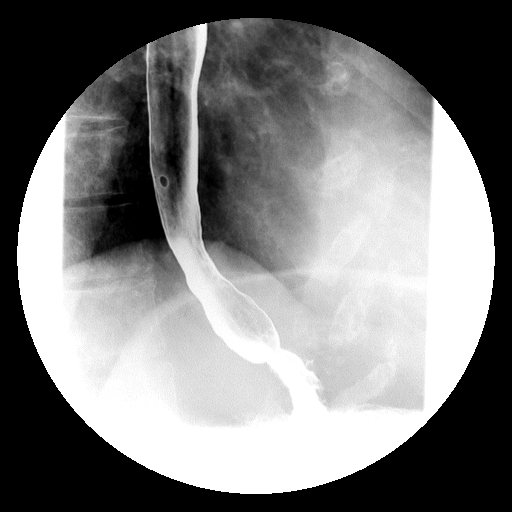

[Series 6: run · 1 of 1 slices shown (5 of 14)]
[im 1/1]
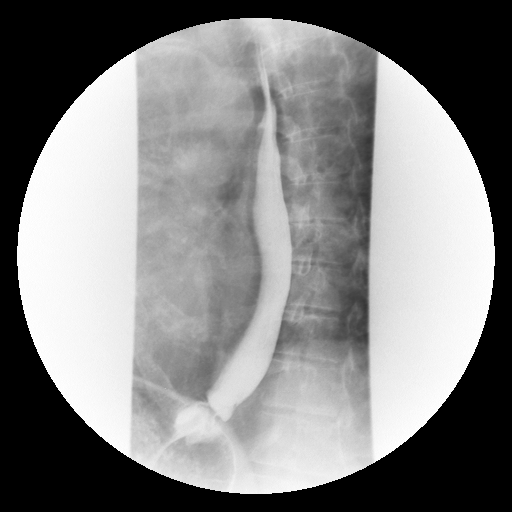

[Series 7: run · 1 of 1 slices shown (6 of 14)]
[im 1/1]
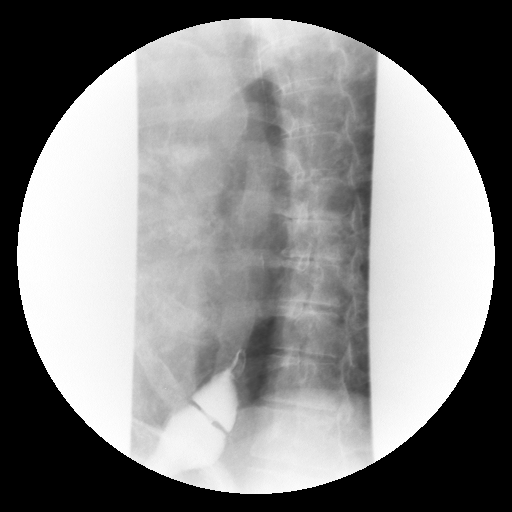

[Series 8: run · 1 of 1 slices shown (7 of 14)]
[im 1/1]
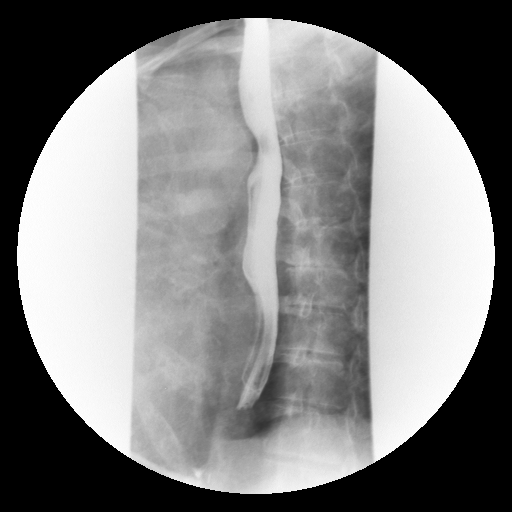

[Series 9: run · 1 of 1 slices shown (8 of 14)]
[im 1/1]
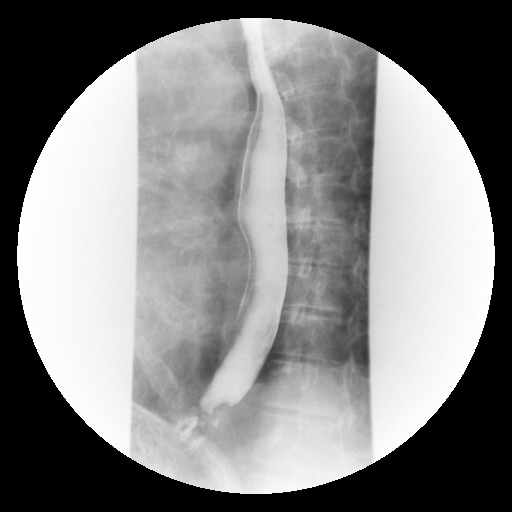

[Series 11: run · 1 of 1 slices shown (9 of 14)]
[im 1/1]
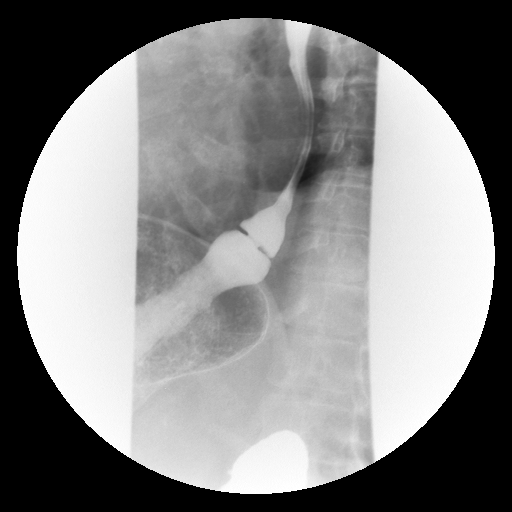

[Series 12: run · 1 of 1 slices shown (10 of 14)]
[im 1/1]
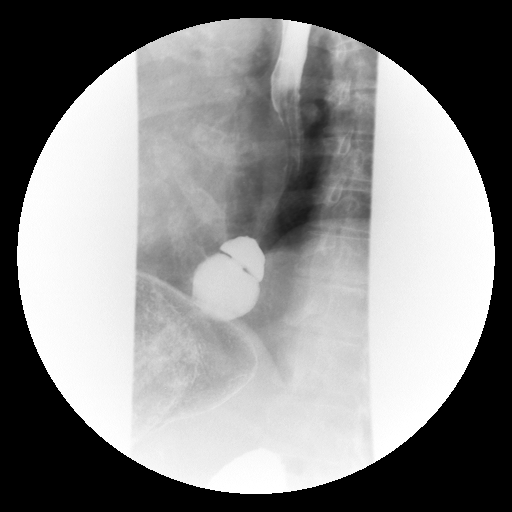

[Series 13: run · 1 of 1 slices shown (11 of 14)]
[im 1/1]
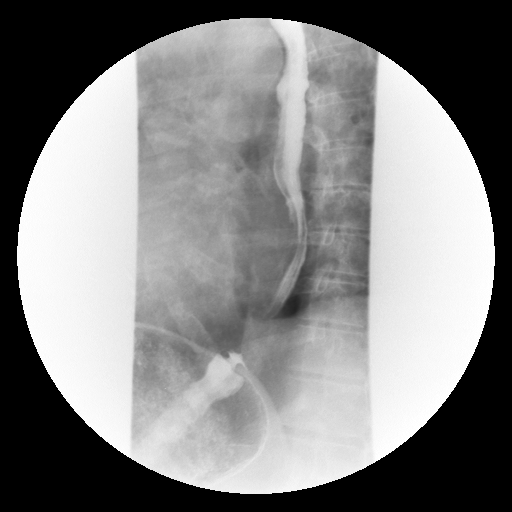

[Series 14: run · 1 of 1 slices shown (12 of 14)]
[im 1/1]
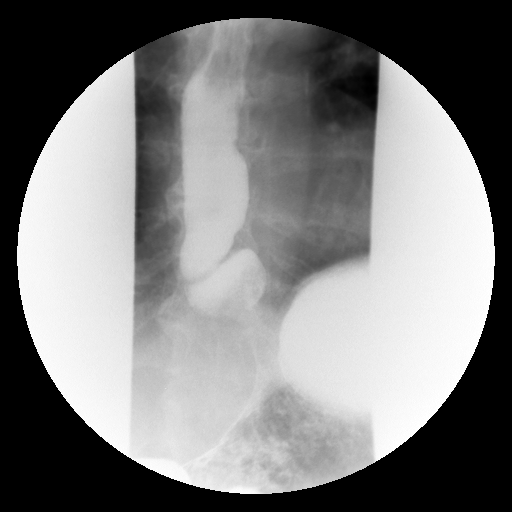

[Series 16: run · 1 of 1 slices shown (13 of 14)]
[im 1/1]
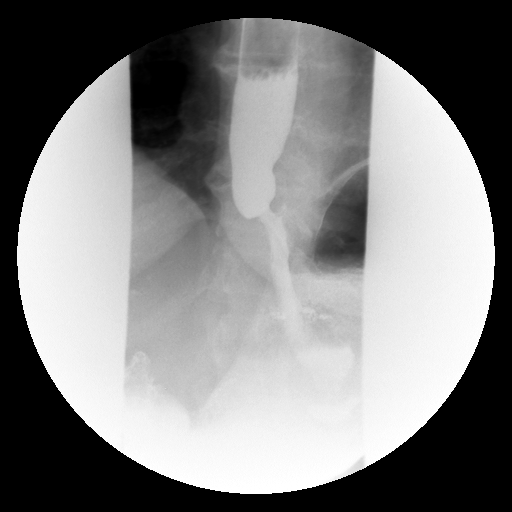

[Series 17: run · 1 of 1 slices shown (14 of 14)]
[im 1/1]
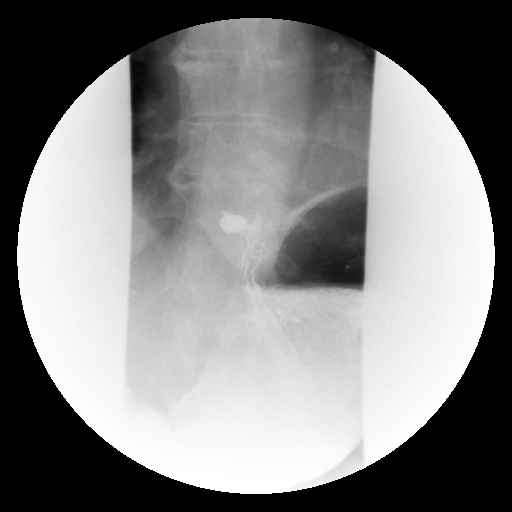

[19 of 24 positions shown; findings below may reference images not displayed]

FINDINGS: Initial barium swallows demonstrate normal pharyngeal
motion with swallowing.  No laryngeal penetration or aspiration.
No webs, strictures or diverticuli.  There is mild degenerative
cervical spondylosis with disc disease and facet disease and mild
impression on the posterior esophagus at C5-6.  The piriform sinus
and vallecular air spaces appear normal.

Esophageal motility was normal.

There is a small sliding-type hiatal hernia and there is a lower
esophageal mucosal ring.  Episodes of gastroesophageal reflux
demonstrated with water swallowing.  The 13 mm barium pill would
not pass into the stomach.
IMPRESSION: 1.  Small sliding-type hiatal hernia with episodes of inducible GE
reflux.
2.  Lower esophageal mucosal ring.  A 13 mm barium pill would not
pass through this area.
3.  Normal esophageal motility.

## 2015-05-26 ENCOUNTER — Encounter: Payer: Self-pay | Admitting: Internal Medicine

## 2016-10-16 ENCOUNTER — Ambulatory Visit: Payer: Medicare Other | Admitting: Gastroenterology

## 2017-02-26 ENCOUNTER — Ambulatory Visit (INDEPENDENT_AMBULATORY_CARE_PROVIDER_SITE_OTHER): Payer: Medicare Other | Admitting: Podiatry

## 2017-02-26 ENCOUNTER — Encounter: Payer: Self-pay | Admitting: Podiatry

## 2017-02-26 VITALS — BP 144/70 | HR 74 | Ht 66.0 in | Wt 165.0 lb

## 2017-02-26 DIAGNOSIS — M722 Plantar fascial fibromatosis: Secondary | ICD-10-CM

## 2017-02-26 DIAGNOSIS — M7731 Calcaneal spur, right foot: Secondary | ICD-10-CM

## 2017-02-26 NOTE — Patient Instructions (Signed)
Seen for bilateral heel pain R>L. Cortisone injection given and Night Splint dispensed. May benefit from custom orthotics. Return in 2 weeks for follow up and diabetic foot care.

## 2017-02-26 NOTE — Progress Notes (Signed)
  SUBJECTIVE: 83 y.o. year old female presents complaining of pain in right heel. Pain in heel R>L x 6 weeks. Hurts all the time. First thing in the morning is about the same as through out the day. Hurts when gets up middle of night. Piercing pain in heel as putting weight on. Right heel has red cracking skin.  Last blood sugar reading was 137.  REVIEW OF SYSTEMS: Pertinent items noted in HPI and remainder of comprehensive ROS otherwise negative.  OBJECTIVE: DERMATOLOGIC EXAMINATION: Nails: Dystrophic nails x 10.  VASCULAR EXAMINATION OF LOWER LIMBS: All pedal pulses are palpable with normal pulsation.  Capillary Filling times within 3 seconds in all digits.  No visible edema or erythema noted. Temperature gradient from tibial crest to dorsum of foot is within normal bilateral.  NEUROLOGIC EXAMINATION OF THE LOWER LIMBS: Achilles DTR is present and within normal. Monofilament (Semmes-Weinstein 10-gm) sensory testing positive 6 out of 6, bilateral. Vibratory sensations(128Hz  turning fork) intact at medial and lateral forefoot bilateral.  Sharp and Dull discriminatory sensations at the plantar ball of hallux is intact bilateral.   MUSCULOSKELETAL EXAMINATION: Positive for high arced cavus foot.  Radiographic examination reveal plantar and posterior calcaneal spur right. High arched cavus type foot. No acute changes seen.  ASSESSMENT: Plantar fasciitis right. Calcaneal spur right.  PLAN: Reviewed findings and available treatment options. Left heel injected with mixture of 4 mg Dexamethasone, 4 mg Triamcinolone, and 1 cc of 0.5% Marcaine plain. Patient tolerated well without difficulty.  Night Splint dispensed with instruction. Return in 2 weeks for follow up.

## 2017-03-19 ENCOUNTER — Ambulatory Visit (INDEPENDENT_AMBULATORY_CARE_PROVIDER_SITE_OTHER): Payer: Medicare Other | Admitting: Podiatry

## 2017-03-19 ENCOUNTER — Encounter: Payer: Self-pay | Admitting: Podiatry

## 2017-03-19 DIAGNOSIS — M722 Plantar fascial fibromatosis: Secondary | ICD-10-CM | POA: Diagnosis not present

## 2017-03-19 DIAGNOSIS — L853 Xerosis cutis: Secondary | ICD-10-CM

## 2017-03-19 DIAGNOSIS — B351 Tinea unguium: Secondary | ICD-10-CM | POA: Diagnosis not present

## 2017-03-19 NOTE — Patient Instructions (Signed)
Follow up on right heel pain. Doing better now. All hypertrophic nails debrided. Scrub dry scaly skin with Salsun blue shampoo after each shower. May use Vitamin A cream for dry skin after each shower. Return in 3 months or as needed.

## 2017-03-19 NOTE — Progress Notes (Signed)
SUBJECTIVE: 83 y.o. year old female presents for follow up on right heel. Pain is much better now on right heel. Patient request toe nails trimmed. Night splint was not very effective for her. Using Dr. Margart Sickles inserts, which helped more.  HPI: Pain in heel R>L since about July 2018. Problem with on going dry cracking skin on both feet.  Not checking blood sugar daily but stays in between 130 and 140.  OBJECTIVE: DERMATOLOGIC EXAMINATION: Nails: Dystrophic nails x 10. Dry peeling skin plantar surface both feet. No open skin lesions noted.  VASCULAR EXAMINATION OF LOWER LIMBS: All pedal pulses are palpable with normal pulsation.  Capillary Filling times within 3 seconds in all digits.  No visible edema or erythema noted. Temperature gradient from tibial crest to dorsum of foot is within normal bilateral.  NEUROLOGIC EXAMINATION OF THE LOWER LIMBS: All epicritic and tactile sensations grossly intact.  MUSCULOSKELETAL EXAMINATION: Positive for high arced cavus foot.  ASSESSMENT: Improving Plantar fasciitis right. Onychomycosis x 10. Xerotic skin plantar surface both feet.  PLAN: Reviewed findings and available treatment options. All hypertrophic nails debrided. Instructed to scrub dry scaly skin with Salsun blue shampoo after each shower. May use Vitamin A cream for dry skin after each shower. Return in 3 months or as needed.

## 2017-06-19 ENCOUNTER — Ambulatory Visit: Payer: Medicare Other | Admitting: Podiatry

## 2019-07-30 DIAGNOSIS — E1165 Type 2 diabetes mellitus with hyperglycemia: Secondary | ICD-10-CM | POA: Diagnosis not present

## 2019-07-30 DIAGNOSIS — E1121 Type 2 diabetes mellitus with diabetic nephropathy: Secondary | ICD-10-CM | POA: Diagnosis not present

## 2019-07-30 DIAGNOSIS — E782 Mixed hyperlipidemia: Secondary | ICD-10-CM | POA: Diagnosis not present

## 2019-08-14 DIAGNOSIS — R531 Weakness: Secondary | ICD-10-CM | POA: Diagnosis not present

## 2019-08-14 DIAGNOSIS — I129 Hypertensive chronic kidney disease with stage 1 through stage 4 chronic kidney disease, or unspecified chronic kidney disease: Secondary | ICD-10-CM | POA: Diagnosis not present

## 2019-08-14 DIAGNOSIS — E1165 Type 2 diabetes mellitus with hyperglycemia: Secondary | ICD-10-CM | POA: Diagnosis not present

## 2019-08-14 DIAGNOSIS — E1121 Type 2 diabetes mellitus with diabetic nephropathy: Secondary | ICD-10-CM | POA: Diagnosis not present

## 2019-08-14 DIAGNOSIS — M1610 Unilateral primary osteoarthritis, unspecified hip: Secondary | ICD-10-CM | POA: Diagnosis not present

## 2019-08-14 DIAGNOSIS — M79651 Pain in right thigh: Secondary | ICD-10-CM | POA: Diagnosis not present

## 2019-08-14 DIAGNOSIS — E782 Mixed hyperlipidemia: Secondary | ICD-10-CM | POA: Diagnosis not present

## 2019-08-28 DIAGNOSIS — M1610 Unilateral primary osteoarthritis, unspecified hip: Secondary | ICD-10-CM | POA: Diagnosis not present

## 2019-08-28 DIAGNOSIS — E782 Mixed hyperlipidemia: Secondary | ICD-10-CM | POA: Diagnosis not present

## 2019-12-14 DIAGNOSIS — E782 Mixed hyperlipidemia: Secondary | ICD-10-CM | POA: Diagnosis not present

## 2019-12-14 DIAGNOSIS — G609 Hereditary and idiopathic neuropathy, unspecified: Secondary | ICD-10-CM | POA: Diagnosis not present

## 2019-12-14 DIAGNOSIS — R634 Abnormal weight loss: Secondary | ICD-10-CM | POA: Diagnosis not present

## 2019-12-14 DIAGNOSIS — E1121 Type 2 diabetes mellitus with diabetic nephropathy: Secondary | ICD-10-CM | POA: Diagnosis not present

## 2019-12-14 DIAGNOSIS — N390 Urinary tract infection, site not specified: Secondary | ICD-10-CM | POA: Diagnosis not present

## 2019-12-14 DIAGNOSIS — N182 Chronic kidney disease, stage 2 (mild): Secondary | ICD-10-CM | POA: Diagnosis not present

## 2019-12-14 DIAGNOSIS — I129 Hypertensive chronic kidney disease with stage 1 through stage 4 chronic kidney disease, or unspecified chronic kidney disease: Secondary | ICD-10-CM | POA: Diagnosis not present

## 2019-12-14 DIAGNOSIS — E1165 Type 2 diabetes mellitus with hyperglycemia: Secondary | ICD-10-CM | POA: Diagnosis not present

## 2019-12-14 DIAGNOSIS — Z0000 Encounter for general adult medical examination without abnormal findings: Secondary | ICD-10-CM | POA: Diagnosis not present

## 2020-01-04 DIAGNOSIS — Z880 Allergy status to penicillin: Secondary | ICD-10-CM | POA: Diagnosis not present

## 2020-01-04 DIAGNOSIS — Z7982 Long term (current) use of aspirin: Secondary | ICD-10-CM | POA: Diagnosis not present

## 2020-01-04 DIAGNOSIS — I10 Essential (primary) hypertension: Secondary | ICD-10-CM | POA: Diagnosis not present

## 2020-01-04 DIAGNOSIS — N182 Chronic kidney disease, stage 2 (mild): Secondary | ICD-10-CM | POA: Diagnosis not present

## 2020-01-04 DIAGNOSIS — E1121 Type 2 diabetes mellitus with diabetic nephropathy: Secondary | ICD-10-CM | POA: Diagnosis not present

## 2020-01-04 DIAGNOSIS — E782 Mixed hyperlipidemia: Secondary | ICD-10-CM | POA: Diagnosis not present

## 2020-01-04 DIAGNOSIS — Z823 Family history of stroke: Secondary | ICD-10-CM | POA: Diagnosis not present

## 2020-01-04 DIAGNOSIS — Z833 Family history of diabetes mellitus: Secondary | ICD-10-CM | POA: Diagnosis not present

## 2020-01-04 DIAGNOSIS — Z0000 Encounter for general adult medical examination without abnormal findings: Secondary | ICD-10-CM | POA: Diagnosis not present

## 2020-01-04 DIAGNOSIS — Z8249 Family history of ischemic heart disease and other diseases of the circulatory system: Secondary | ICD-10-CM | POA: Diagnosis not present

## 2020-01-04 DIAGNOSIS — E1165 Type 2 diabetes mellitus with hyperglycemia: Secondary | ICD-10-CM | POA: Diagnosis not present

## 2020-01-04 DIAGNOSIS — Z888 Allergy status to other drugs, medicaments and biological substances status: Secondary | ICD-10-CM | POA: Diagnosis not present

## 2020-01-04 DIAGNOSIS — E11620 Type 2 diabetes mellitus with diabetic dermatitis: Secondary | ICD-10-CM | POA: Diagnosis not present

## 2020-01-04 DIAGNOSIS — R5383 Other fatigue: Secondary | ICD-10-CM | POA: Diagnosis not present

## 2020-01-04 DIAGNOSIS — Z7984 Long term (current) use of oral hypoglycemic drugs: Secondary | ICD-10-CM | POA: Diagnosis not present

## 2020-01-04 DIAGNOSIS — R1319 Other dysphagia: Secondary | ICD-10-CM | POA: Diagnosis not present

## 2020-01-04 DIAGNOSIS — I129 Hypertensive chronic kidney disease with stage 1 through stage 4 chronic kidney disease, or unspecified chronic kidney disease: Secondary | ICD-10-CM | POA: Diagnosis not present

## 2020-01-04 DIAGNOSIS — G609 Hereditary and idiopathic neuropathy, unspecified: Secondary | ICD-10-CM | POA: Diagnosis not present

## 2020-01-12 ENCOUNTER — Telehealth: Payer: Self-pay | Admitting: Gastroenterology

## 2020-01-12 NOTE — Telephone Encounter (Signed)
Difficulty swallowing. History of same and has had esophageal dilation by Dr Juanda Chance in the past. She feels like food is not passing well. It feels stuck in her throat. This has affected her nutritional status. Appointment made with Mike Gip, PA 01/25/20. Patient requests Dr Lavon Paganini as her primary GI.

## 2020-01-12 NOTE — Telephone Encounter (Signed)
Patient called states she is loosing a lot of weight and would like to discuss with a nurse.

## 2020-01-25 ENCOUNTER — Encounter: Payer: Self-pay | Admitting: Physician Assistant

## 2020-01-25 ENCOUNTER — Ambulatory Visit: Payer: Medicare PPO | Admitting: Physician Assistant

## 2020-01-25 ENCOUNTER — Other Ambulatory Visit (INDEPENDENT_AMBULATORY_CARE_PROVIDER_SITE_OTHER): Payer: Medicare PPO

## 2020-01-25 VITALS — BP 106/60 | HR 80 | Ht 66.0 in | Wt 139.5 lb

## 2020-01-25 DIAGNOSIS — F5000 Anorexia nervosa, unspecified: Secondary | ICD-10-CM

## 2020-01-25 DIAGNOSIS — R634 Abnormal weight loss: Secondary | ICD-10-CM | POA: Insufficient documentation

## 2020-01-25 DIAGNOSIS — R1310 Dysphagia, unspecified: Secondary | ICD-10-CM | POA: Insufficient documentation

## 2020-01-25 DIAGNOSIS — R1319 Other dysphagia: Secondary | ICD-10-CM

## 2020-01-25 LAB — BASIC METABOLIC PANEL
BUN: 21 mg/dL (ref 6–23)
CO2: 26 mEq/L (ref 19–32)
Calcium: 9.8 mg/dL (ref 8.4–10.5)
Chloride: 97 mEq/L (ref 96–112)
Creatinine, Ser: 0.92 mg/dL (ref 0.40–1.20)
GFR: 58.08 mL/min — ABNORMAL LOW (ref >60.00)
Glucose, Bld: 125 mg/dL — ABNORMAL HIGH (ref 70–99)
Potassium: 3.8 mEq/L (ref 3.5–5.1)
Sodium: 135 mEq/L (ref 135–145)

## 2020-01-25 LAB — CBC WITH DIFFERENTIAL/PLATELET
Basophils Absolute: 0.1 10*3/uL (ref 0.0–0.1)
Basophils Relative: 0.5 % (ref 0.0–3.0)
Eosinophils Absolute: 0.1 10*3/uL (ref 0.0–0.7)
Eosinophils Relative: 1.3 % (ref 0.0–5.0)
HCT: 40.6 % (ref 36.0–46.0)
Hemoglobin: 13.8 g/dL (ref 12.0–15.0)
Lymphocytes Relative: 23.9 % (ref 12.0–46.0)
Lymphs Abs: 2.7 10*3/uL (ref 0.7–4.0)
MCHC: 33.9 g/dL (ref 30.0–36.0)
MCV: 91.6 fl (ref 78.0–100.0)
Monocytes Absolute: 0.7 10*3/uL (ref 0.1–1.0)
Monocytes Relative: 6.5 % (ref 3.0–12.0)
Neutro Abs: 7.5 10*3/uL (ref 1.4–7.7)
Neutrophils Relative %: 67.8 % (ref 43.0–77.0)
Platelets: 201 10*3/uL (ref 150.0–400.0)
RBC: 4.43 Mil/uL (ref 3.87–5.11)
RDW: 12.3 % (ref 11.5–15.5)
WBC: 11.1 10*3/uL — ABNORMAL HIGH (ref 4.0–10.5)

## 2020-01-25 LAB — SEDIMENTATION RATE: Sed Rate: 18 mm/hr (ref 0–30)

## 2020-01-25 LAB — TSH: TSH: 2.23 u[IU]/mL (ref 0.35–4.50)

## 2020-01-25 NOTE — Progress Notes (Signed)
Subjective:    Patient ID: Dominique Simmons, female    DOB: 06-29-1933, 86 y.o.   MRN: [id number]  HPI  [name] is a pleasant 86 year old white female, known previously to Dr. Olevia Simmons, and last seen here in 2013 who comes in today with concerns about solid food dysphagia, and weight loss. Patient has history of hypertension and adult onset diabetes mellitus. She had undergone colonoscopy in 2011 which was a normal exam, and had EGD x2 in 2013 for dysphagia.  With initial EGD she was dilated to 13 mm, and repeat exam 1 month later with finding of lower esophageal stricture, the 9 mm scope would not initially pass then popped through by report and she was dilated to 15 mm. She says she did well for a long time after that and really just over the past several months has had recurrence of dysphagia.  She has also noticed increase in burping with meals recently.  She says that when she is eating she feels like the food is sitting or stopping in her chest and then gradually goes down.  She is not having any difficulty with liquids.  She has also had some problems with pills recently especially Metformin which feels as if it sits for a while in her chest.  Current symptoms are occurring with almost every meal. She also says that over the past year she has lost between 25 to 30 pounds and says she has absolutely no appetite.  She does not attribute her weight loss to dysphagia but more to the fact that she has no appetite.  She has not had any nausea or vomiting, she says she has some abdominal soreness which is chronic for her over many years.  The only change in her meds was an increase in Metformin from 1 tablet daily 500 mg up to 4 tablets daily within the past year. She is mention the weight loss to her PCP Dr. Ashby Simmons,, apparently chest x-ray was done and negative, no other suggestions were made.   Review of Systems Pertinent positive and negative review of systems were noted in the above HPI section.   All other review of systems was otherwise negative.  Outpatient Encounter Medications as of 01/25/2020  Medication Sig  . acetaminophen (TYLENOL) 325 MG tablet Take 650 mg by mouth every 6 (six) hours as needed.  Marland Kitchen amLODipine (NORVASC) 5 MG tablet Take 5 mg by mouth daily.  Marland Kitchen aspirin 81 MG tablet Take 81 mg by mouth daily.  . Cholecalciferol (VITAMIN D3) 1.25 MG (50000 UT) CAPS Take by mouth.  . clotrimazole-betamethasone (LOTRISONE) cream   . hydrochlorothiazide (HYDRODIURIL) 25 MG tablet Take 25 mg by mouth daily.  Javier Docker Oil 500 MG CAPS Take by mouth.  . losartan (COZAAR) 25 MG tablet Take 25 mg by mouth daily.  . metFORMIN (GLUCOPHAGE-XR) 500 MG 24 hr tablet Take 500 mg by mouth daily with breakfast.  . [DISCONTINUED] beta carotene w/minerals (OCUVITE) tablet Take 1 tablet by mouth daily.  . [DISCONTINUED] Chromium-Cinnamon (CINNAMON PLUS CHROMIUM PO) Take 1 tablet by mouth daily.  . [DISCONTINUED] Ginkgo Biloba (GINKOBA) 40 MG TABS Take 1 tablet by mouth daily.   No facility-administered encounter medications on file as of 01/25/2020.   Allergies  Allergen Reactions  . Amoxicillin     REACTION: rash  . Lescol [Fluvastatin Sodium]     Myalgia  . Lipitor [Atorvastatin]     Myalgia  . Lisinopril     Cough  . Pravachol [facility]  Sodium]     Myalgia   Patient Active Problem List   Diagnosis Date Noted  . Loss of weight 01/25/2020  . Dysphagia 01/25/2020  . Onychomycosis 03/19/2017  . Plantar fasciitis, right 03/19/2017   Social History   Socioeconomic History  . Marital status: Married    Spouse name: Not on file  . Number of children: 2  . Years of education: Not on file  . Highest education level: Not on file  Occupational History  . Occupation: retired    Fish farm manager: RETIRED  Tobacco Use  . Smoking status: Never Smoker  . Smokeless tobacco: Never Used  Vaping Use  . Vaping Use: Never used  Substance and Sexual Activity  . Alcohol use: No  . Drug use: No  .  Sexual activity: Not on file  Other Topics Concern  . Not on file  Social History Narrative  . Not on file   Social Determinants of Health   Financial Resource Strain:   . Difficulty of Paying Living Expenses:   Food Insecurity:   . Worried About Charity fundraiser in the Last Year:   . Arboriculturist in the Last Year:   Transportation Needs:   . Film/video editor (Medical):   Marland Kitchen Lack of Transportation (Non-Medical):   Physical Activity:   . Days of Exercise per Week:   . Minutes of Exercise per Session:   Stress:   . Feeling of Stress :   Social Connections:   . Frequency of Communication with Friends and Family:   . Frequency of Social Gatherings with Friends and Family:   . Attends Religious Services:   . Active Member of Clubs or Organizations:   . Attends Archivist Meetings:   Marland Kitchen Marital Status:   Intimate Partner Violence:   . Fear of Current or Ex-Partner:   . Emotionally Abused:   Marland Kitchen Physically Abused:   . Sexually Abused:     Ms. Graffeo family history includes Coronary artery disease in her father, mother, and sister; Diabetes in her brother; Heart attack in her sister; Heart disease in her father and mother.      Objective:    Vitals:   01/25/20 1330  BP: 106/60  Pulse: 80    Physical Exam Well-developed well-nourished elderly WF in no acute distress.  Height, Weight,139 BMI 22.5  HEENT; nontraumatic normocephalic, EOMI, PER R LA, sclera anicteric. Oropharynx; not examined Neck; supple, no JVD Cardiovascular; regular rate and rhythm with S1-S2, no murmur rub or gallop Pulmonary; Clear bilaterally Abdomen; soft, nontender, nondistended, no palpable mass or hepatosplenomegaly, bowel sounds are active Rectal;not done today Skin; benign exam, no jaundice rash or appreciable lesions Extremities; no clubbing cyanosis or edema skin warm and dry Neuro/Psych; alert and oriented x4, grossly nonfocal mood and affect appropriate       Assessment & Plan:   #22 86 year old white female with history of esophageal stricture requiring dilation in 2013, now presenting with recurrent solid food and pill dysphagia over the past several months. Suspect recurrent distal esophageal stricture  #2 weight loss 25 to 30 pounds over the past year associated with anorexia. Patient does not associate the weight loss with dysphagia but rather to complete lack of appetite. Etiology not clear, this may be medication related i.e. Metformin which can be associated with anorexia and appetite changes.  Rule out underlying malignancy, rule out hyperthyroid.  #3 hypertension 4.  Adult onset diabetes mellitus  Plan; CBC, be met, TSH and  sed rate Patient will be scheduled for CT scan of the abdomen pelvis with contrast Patient will also be scheduled for upper endoscopy with probable esophageal dilation with Dr. Silverio Decamp  (patient requested Dr. Silverio Decamp ).  Procedure was discussed in detail with the patient including indications risks and benefits and she is agreeable to proceed. She has completed COVID-19 vaccination. Further recommendations pending results of above.  If work-up is negative, she will likely need to come off Metformin.  Erienne Spelman S Peg Fifer PA-C 01/25/2020   Cc: Merrilee Seashore, MD

## 2020-01-25 NOTE — Patient Instructions (Signed)
If you are age 84 or older, your body mass index should be between 23-30. Your Body mass index is 22.52 kg/m. If this is out of the aforementioned range listed, please consider follow up with your Primary Care Provider.  If you are age 77 or younger, your body mass index should be between 19-25. Your Body mass index is 22.52 kg/m. If this is out of the aformentioned range listed, please consider follow up with your Primary Care Provider.   You have been scheduled for a colonoscopy. Please follow written instructions given to you at your visit today.  Please pick up your prep supplies at the pharmacy within the next 1-3 days. If you use inhalers (even only as needed), please bring them with you on the day of your procedure.  You have been scheduled for a CT scan of the abdomen and pelvis at [facility], 1st floor Radiology. You are scheduled on 02/09/20  at 4:00 pm. You should arrive 15 minutes prior to your appointment time for registration. Please follow the written instructions below on the day of your exam:    1) Do not eat anything after 12:00 pm (4 hours prior to your test)  2) You to pick up (At least 3 days prior to your procedure you will need to pick up) 2 bottles of oral contrast to drink.  The solution may taste better if refrigerated, but do NOT add ice or any other liquid to this solution. Shake well before drinking.   Drink 1 bottle of contrast @ 2:00 pm (2 hours prior to your exam)  Drink 1 bottle of contrast @ 3:00 pm (1 hour prior to your exam)   You may take any medications as prescribed with a small amount of water, if necessary. If you take any of the following medications: METFORMIN, GLUCOPHAGE, GLUCOVANCE, AVANDAMET, RIOMET, FORTAMET, [place] MET, JANUMET, GLUMETZA or METAGLIP, you MAY be asked to HOLD this medication 48 hours AFTER the exam.   The purpose of you drinking the oral contrast is to aid in the visualization of your intestinal tract. The contrast solution may  cause some diarrhea. Depending on your individual set of symptoms, you may also receive an intravenous injection of x-ray contrast/dye. Plan on being at [facility] for 45 minutes or longer, depending on the type of exam you are having performed.   If you have any questions regarding your exam or if you need to reschedule, you may call [facility] at [phone] between the hours of 8:00 am and 5:00 pm, Monday-Friday.   Your provider has requested that you go to the basement level for lab work before leaving today. Press "B" on the elevator. The lab is located at the first door on the left as you exit the elevator.  Due to recent changes in healthcare laws, you may see the results of your imaging and laboratory studies on MyChart before your provider has had a chance to review them.  We understand that in some cases there may be results that are confusing or concerning to you. Not all laboratory results come back in the same time frame and the provider may be waiting for multiple results in order to interpret others.  Please give [place] 48 hours in order for your provider to thoroughly review all the results before contacting the office for clarification of your results.   Follow up pending at this time.

## 2020-01-28 DIAGNOSIS — R0602 Shortness of breath: Secondary | ICD-10-CM | POA: Diagnosis not present

## 2020-02-01 ENCOUNTER — Other Ambulatory Visit: Payer: Self-pay

## 2020-02-01 ENCOUNTER — Encounter: Payer: Self-pay | Admitting: Gastroenterology

## 2020-02-01 ENCOUNTER — Ambulatory Visit (AMBULATORY_SURGERY_CENTER): Payer: Medicare PPO | Admitting: Gastroenterology

## 2020-02-01 VITALS — BP 125/79 | HR 67 | Temp 97.8°F | Resp 21 | Ht 66.0 in | Wt 139.0 lb

## 2020-02-01 DIAGNOSIS — R1310 Dysphagia, unspecified: Secondary | ICD-10-CM

## 2020-02-01 DIAGNOSIS — R634 Abnormal weight loss: Secondary | ICD-10-CM | POA: Diagnosis not present

## 2020-02-01 DIAGNOSIS — K2210 Ulcer of esophagus without bleeding: Secondary | ICD-10-CM | POA: Diagnosis not present

## 2020-02-01 DIAGNOSIS — R1319 Other dysphagia: Secondary | ICD-10-CM

## 2020-02-01 DIAGNOSIS — E119 Type 2 diabetes mellitus without complications: Secondary | ICD-10-CM | POA: Diagnosis not present

## 2020-02-01 DIAGNOSIS — I10 Essential (primary) hypertension: Secondary | ICD-10-CM | POA: Diagnosis not present

## 2020-02-01 DIAGNOSIS — K222 Esophageal obstruction: Secondary | ICD-10-CM

## 2020-02-01 MED ORDER — SUCRALFATE 1 GM/10ML PO SUSP: 1.0000 g | Freq: Four times a day (QID) | ORAL | 3 refills | Status: DC

## 2020-02-01 MED ORDER — LANSOPRAZOLE 30 MG PO CPDR: 30.0000 mg | DELAYED_RELEASE_CAPSULE | Freq: Every day | ORAL | 0 refills | Status: DC

## 2020-02-01 MED ORDER — SODIUM CHLORIDE 0.9 % IV SOLN
500.0000 mL | Freq: Once | INTRAVENOUS | Status: DC
Start: 2020-02-01 — End: 2020-02-01

## 2020-02-01 NOTE — Op Note (Signed)
[facility] Patient Name: Dominique Simmons Procedure Date: 02/01/2020 10:39 AM MRN: [id number] Endoscopist: Napoleon Form , MD Age: 86 Referring MD:  Date of Birth: 1933-11-30 Gender: Female Account #: [id number] Procedure:                Upper GI endoscopy Indications:              Dysphagia, Weight loss Medicines:                Monitored Anesthesia Care Procedure:                Pre-Anesthesia Assessment:                           - Prior to the procedure, a History and Physical                            was performed, and patient medications and                            allergies were reviewed. The patient's tolerance of                            previous anesthesia was also reviewed. The risks                            and benefits of the procedure and the sedation                            options and risks were discussed with the patient.                            All questions were answered, and informed consent                            was obtained. Prior Anticoagulants: The patient has                            taken no previous anticoagulant or antiplatelet                            agents. ASA Grade Assessment: II - A patient with                            mild systemic disease. After reviewing the risks                            and benefits, the patient was deemed in                            satisfactory condition to undergo the procedure.                           After obtaining informed consent, the endoscope was  passed under direct vision. Throughout the                            procedure, the patient's blood pressure, pulse, and                            oxygen saturations were monitored continuously. The                            Endoscope was introduced through the mouth, and                            advanced to the lower third of esophagus. The upper                            GI endoscopy was  accomplished without difficulty.                            The patient tolerated the procedure well. Scope In: Scope Out: Findings:                 One benign-appearing, intrinsic severe (stenosis;                            an endoscope cannot pass) stenosis was found 34 to                            36 cm from the incisors. This stenosis measured 7                            mm (inner diameter) x 2 cm (in length). The                            stenosis was not traversed.                           Few cratered and superficial esophageal ulcers                            oozing blood were found 34 to 36 cm from the                            incisors. The largest lesion was 10 mm in largest                            dimension. Biopsies were taken with a cold forceps                            for histology. Complications:            No immediate complications. Estimated Blood Loss:     Estimated blood loss was minimal. Impression:               - Benign-appearing esophageal stenosis.                           -  Esophageal ulcers oozing blood. Biopsied. Recommendation:           - Pureed diet and soft diet.                           - Continue present medications.                           - Use Prevacid (lansoprazole) 30 mg PO BID for 3                            months.                           - Follow an antireflux regimen.                           - Use sucralfate suspension 1 gram PO QID for 3                            months.                           - Return to GI office at the next available                            appointment in 4-8 weeks, will need to schedule EGD                            with esophageal dilation at that time. Napoleon Form, MD 02/01/2020 11:12:56 AM This report has been signed electronically.

## 2020-02-01 NOTE — Progress Notes (Signed)
Called to room to assist during endoscopic procedure.  Patient ID and intended procedure confirmed with present staff. Received instructions for my participation in the procedure from the performing physician.  

## 2020-02-01 NOTE — Progress Notes (Signed)
Pt's states no medical or surgical changes since previsit or office visit.  Cw vitals and Jm IV.

## 2020-02-01 NOTE — Patient Instructions (Signed)
HANDOUTS PROVIDED ON: SOFT DIET  The biopsies taken today have been sent for pathology.  The results can take 1-3 weeks to receive.    Thank you for allowing [place] to care for you today!!!   YOU HAD AN ENDOSCOPIC PROCEDURE TODAY AT THE [facility]:   Refer to the procedure report that was given to you for any specific questions about what was found during the examination.  If the procedure report does not answer your questions, please call your gastroenterologist to clarify.  If you requested that your care partner not be given the details of your procedure findings, then the procedure report has been included in a sealed envelope for you to review at your convenience later.  YOU SHOULD EXPECT: Some feelings of bloating in the abdomen. Passage of more gas than usual.  Walking can help get rid of the air that was put into your GI tract during the procedure and reduce the bloating.   Please Note:  You might notice some irritation and congestion in your nose or some drainage.  This is from the oxygen used during your procedure.  There is no need for concern and it should clear up in a day or so.  SYMPTOMS TO REPORT IMMEDIATELY:   Following upper endoscopy (EGD)  Vomiting of blood or coffee ground material  New chest pain or pain under the shoulder blades  Painful or persistently difficult swallowing  New shortness of breath  Fever of 100F or higher  Black, tarry-looking stools  For urgent or emergent issues, a gastroenterologist can be reached at any hour by calling [phone]. Do not use MyChart messaging for urgent concerns.    DIET:  We do recommend a small meal at first, but then you may proceed to your regular diet.  Drink plenty of fluids but you should avoid alcoholic beverages for 24 hours.  ACTIVITY:  You should plan to take it easy for the rest of today and you should NOT DRIVE or use heavy machinery until tomorrow (because of the sedation medicines used during the  test).    FOLLOW UP: Our staff will call the number listed on your records 48-72 hours following your procedure to check on you and address any questions or concerns that you may have regarding the information given to you following your procedure. If we do not reach you, we will leave a message.  We will attempt to reach you two times.  During this call, we will ask if you have developed any symptoms of COVID 19. If you develop any symptoms (ie: fever, flu-like symptoms, shortness of breath, cough etc.) before then, please call [phone].  If you test positive for Covid 19 in the 2 weeks post procedure, please call and report this information to [place].    If any biopsies were taken you will be contacted by phone or by letter within the next 1-3 weeks.  Please call us at [phone] if you have not heard about the biopsies in 3 weeks.    SIGNATURES/CONFIDENTIALITY: You and/or your care partner have signed paperwork which will be entered into your electronic medical record.  These signatures attest to the fact that that the information above on your After Visit Summary has been reviewed and is understood.  Full responsibility of the confidentiality of this discharge information lies with you and/or your care-partner.

## 2020-02-01 NOTE — Progress Notes (Signed)
Report to PACU, RN, vss, BBS= Clear.  

## 2020-02-01 NOTE — Progress Notes (Signed)
Reviewed and agree with documentation and assessment and plan. K. Veena Janeva Peaster , MD   

## 2020-02-03 ENCOUNTER — Telehealth: Payer: Self-pay | Admitting: *Deleted

## 2020-02-03 ENCOUNTER — Telehealth: Payer: Self-pay

## 2020-02-03 NOTE — Telephone Encounter (Signed)
No answer, left message to call back later today, B.Fanta Wimberley RN. 

## 2020-02-03 NOTE — Telephone Encounter (Signed)
  Follow up Call-  Call back number 02/01/2020  Post procedure Call Back phone  # [phone]  Permission to leave phone message Yes  Some recent data might be hidden     Patient questions:  Do you have a fever, pain , or abdominal swelling? No. Pain Score  0 *  Have you tolerated food without any problems? Yes.    Have you been able to return to your normal activities? Yes.    Do you have any questions about your discharge instructions: Diet   No. Medications  No. Follow up visit  No.  Do you have questions or concerns about your Care? No.  Actions: * If pain score is 4 or above: No action needed, pain <4.  1. Have you developed a fever since your procedure? no  2.   Have you had an respiratory symptoms (SOB or cough) since your procedure? no  3.   Have you tested positive for COVID 19 since your procedure no  4.   Have you had any family members/close contacts diagnosed with the COVID 19 since your procedure?  no   If yes to any of these questions please route to Laverna Peace, RN and Karlton Lemon, RN

## 2020-02-08 ENCOUNTER — Encounter (HOSPITAL_COMMUNITY): Payer: Self-pay

## 2020-02-08 ENCOUNTER — Ambulatory Visit (HOSPITAL_COMMUNITY)
Admission: RE | Admit: 2020-02-08 | Discharge: 2020-02-08 | Disposition: A | Discharge status: Home / Self Care | Payer: Medicare PPO | Source: Ambulatory Visit | Attending: Physician Assistant | Admitting: Physician Assistant

## 2020-02-08 ENCOUNTER — Other Ambulatory Visit: Payer: Self-pay

## 2020-02-08 DIAGNOSIS — K4020 Bilateral inguinal hernia, without obstruction or gangrene, not specified as recurrent: Secondary | ICD-10-CM | POA: Diagnosis not present

## 2020-02-08 DIAGNOSIS — R634 Abnormal weight loss: Secondary | ICD-10-CM | POA: Insufficient documentation

## 2020-02-08 DIAGNOSIS — R1310 Dysphagia, unspecified: Secondary | ICD-10-CM | POA: Insufficient documentation

## 2020-02-08 DIAGNOSIS — F5000 Anorexia nervosa, unspecified: Secondary | ICD-10-CM | POA: Insufficient documentation

## 2020-02-08 DIAGNOSIS — K449 Diaphragmatic hernia without obstruction or gangrene: Secondary | ICD-10-CM | POA: Diagnosis not present

## 2020-02-08 DIAGNOSIS — N281 Cyst of kidney, acquired: Secondary | ICD-10-CM | POA: Diagnosis not present

## 2020-02-08 DIAGNOSIS — I700 Atherosclerosis of aorta: Secondary | ICD-10-CM | POA: Diagnosis not present

## 2020-02-08 MED ORDER — SODIUM CHLORIDE (PF) 0.9 % IJ SOLN
INTRAMUSCULAR | Status: AC
  Filled 2020-02-08: qty 50

## 2020-02-08 MED ORDER — IOHEXOL 300 MG/ML  SOLN
100.0000 mL | Freq: Once | INTRAMUSCULAR | Status: AC | PRN
  Administered 2020-02-08: 100 mL via INTRAVENOUS

## 2020-02-11 ENCOUNTER — Telehealth: Payer: Self-pay | Admitting: Gastroenterology

## 2020-02-11 NOTE — Telephone Encounter (Signed)
Spoke with the patient. CT results are reviewed. No cause of the weight loss found. Wall thickening of bladder. Biopsy has not been reviewed by the provider. No cancer. Patient advised to continue the medications as prescribed. Wait for letter. Appointment scheduled for 03/29/20 1:50 pm with Dr Lavon Paganini.

## 2020-03-29 ENCOUNTER — Encounter: Payer: Self-pay | Admitting: Gastroenterology

## 2020-03-29 ENCOUNTER — Ambulatory Visit: Payer: Medicare PPO | Admitting: Gastroenterology

## 2020-03-29 DIAGNOSIS — K222 Esophageal obstruction: Secondary | ICD-10-CM | POA: Diagnosis not present

## 2020-03-29 DIAGNOSIS — R634 Abnormal weight loss: Secondary | ICD-10-CM

## 2020-03-29 DIAGNOSIS — R1319 Other dysphagia: Secondary | ICD-10-CM

## 2020-03-29 MED ORDER — SUCRALFATE 1 G PO TABS: 1.0000 g | ORAL_TABLET | Freq: Three times a day (TID) | ORAL | 3 refills | Status: DC

## 2020-03-29 MED ORDER — LANSOPRAZOLE 30 MG PO CPDR: 30.0000 mg | DELAYED_RELEASE_CAPSULE | Freq: Every day | ORAL | 11 refills | Status: DC

## 2020-03-29 NOTE — Patient Instructions (Signed)
You have been scheduled for an endoscopy. Please follow written instructions given to you at your visit today. If you use inhalers (even only as needed), please bring them with you on the day of your procedure.   Take benefiber 1 teaspoon three times a day with meals  We will refill your lansoprazole and carafate today  Due to recent changes in healthcare laws, you may see the results of your imaging and laboratory studies on MyChart before your provider has had a chance to review them.  We understand that in some cases there may be results that are confusing or concerning to you. Not all laboratory results come back in the same time frame and the provider may be waiting for multiple results in order to interpret others.  Please give [place] 48 hours in order for your provider to thoroughly review all the results before contacting the office for clarification of your results.   If you are age 42 or older, your body mass index should be between 23-30. Your Body mass index is 23.08 kg/m. If this is out of the aforementioned range listed, please consider follow up with your Primary Care Provider.  If you are age 36 or younger, your body mass index should be between 19-25. Your Body mass index is 23.08 kg/m. If this is out of the aformentioned range listed, please consider follow up with your Primary Care Provider.    I appreciate the  opportunity to care for you  Thank You   Marsa Aris , MD

## 2020-03-29 NOTE — Progress Notes (Signed)
Dominique Simmons    [id number]    Sep 18, 1933  Primary Care Physician:Ramachandran, Campbell Lerner, MD  Referring Physician: Georgianne Fick, MD [address] [place],  [place] [address]   Chief complaint:  Dysphagia, Weight loss, GERD  HPI:  86 year old very pleasant female here for follow-up visit for dysphagia and GERD  EGD 02/01/20: Esophaeal ulcers, benign stricture, negative for dysplasia  She is taking Prevacid and Carafate, reports significant improvement on her symptoms she is no longer choking on food like she was but she has not advance to regular diet currently eating mostly soft diet.  She is no longer losing weight, her weight has stabilized.  Her bowel habits have changed, thinks because of her restricted diet.  She is getting intermittent constipation and is unable to evacuate completely.  She had undergone colonoscopy in 2011 which was a normal exam, and had EGD x2 in 2013 for dysphagia.  With initial EGD she was dilated to 13 mm, and repeat exam 1 month later with finding of lower esophageal stricture, the 9 mm scope would not initially pass then popped through by report and she was dilated to 15 mm.  Outpatient Encounter Medications as of 03/29/2020  Medication Sig  . acetaminophen (TYLENOL) 325 MG tablet Take 650 mg by mouth every 6 (six) hours as needed.  Marland Kitchen amLODipine (NORVASC) 5 MG tablet Take 5 mg by mouth daily.  Marland Kitchen aspirin 81 MG tablet Take 81 mg by mouth daily.  . Cholecalciferol (VITAMIN D3) 1.25 MG (50000 UT) CAPS Take by mouth.  . clotrimazole-betamethasone (LOTRISONE) cream   . hydrochlorothiazide (HYDRODIURIL) 25 MG tablet Take 25 mg by mouth daily.  Boris Lown Oil 500 MG CAPS Take by mouth.  . lansoprazole (PREVACID) 30 MG capsule Take 1 capsule (30 mg total) by mouth daily at 12 noon.  Marland Kitchen losartan (COZAAR) 25 MG tablet Take 25 mg by mouth daily.  . sucralfate (CARAFATE) 1 GM/10ML suspension Take 10 mLs (1 g total) by mouth 4 (four)  times daily.  . metFORMIN (GLUCOPHAGE-XR) 500 MG 24 hr tablet Take 100 mg by mouth 2 (two) times daily.  (Patient not taking: Reported on 03/29/2020)   No facility-administered encounter medications on file as of 03/29/2020.    Allergies as of 03/29/2020 - Review Complete 03/29/2020  Allergen Reaction Noted  . Amoxicillin  04/04/2010  . Lescol [fluvastatin sodium]  02/26/2017  . Lipitor [atorvastatin]  02/26/2017  . Lisinopril  02/26/2017  . Pravachol [pravastatin sodium]  02/26/2017    Past Medical History:  Diagnosis Date  . Arthritis   . Cataract    BEGINING  . Esophageal ring 11/2011  . GERD (gastroesophageal reflux disease)   . Hyperlipidemia   . Hypertension   . IBS (irritable bowel syndrome)   . Sliding hiatal hernia 11/2011    Past Surgical History:  Procedure Laterality Date  . COLONOSCOPY    . KNEE ARTHROSCOPY  11/2006   right  . ROTATOR CUFF REPAIR     left  . TONSILLECTOMY    . TOTAL ABDOMINAL HYSTERECTOMY  1980  . VEIN LIGATION AND STRIPPING  2003   bilateral  . WRIST SURGERY     left    Family History  Problem Relation Age of [place]  . Coronary artery disease Mother   . Heart disease Mother   . Coronary artery disease Father   . Heart disease Father   . Coronary artery disease Sister   .  Heart attack Sister   . Diabetes Brother   . Colon cancer Neg Hx   . Esophageal cancer Neg Hx   . Rectal cancer Neg Hx   . Stomach cancer Neg Hx     Social History   Socioeconomic History  . Marital status: Married    Spouse name: Not on file  . Number of children: 2  . Years of education: Not on file  . Highest education level: Not on file  Occupational History  . Occupation: retired    Associate Professor: RETIRED  Tobacco Use  . Smoking status: Never Smoker  . Smokeless tobacco: Never Used  Vaping Use  . Vaping Use: Never used  Substance and Sexual Activity  . Alcohol use: No  . Drug use: No  . Sexual activity: Not on file  Other Topics Concern  . Not  on file  Social History Narrative  . Not on file   Social Determinants of Health   Financial Resource Strain:   . Difficulty of Paying Living Expenses: Not on file  Food Insecurity:   . Worried About Programme researcher, broadcasting/film/video in the Last Year: Not on file  . Ran Out of Food in the Last Year: Not on file  Transportation Needs:   . Lack of Transportation (Medical): Not on file  . Lack of Transportation (Non-Medical): Not on file  Physical Activity:   . Days of Exercise per Week: Not on file  . Minutes of Exercise per Session: Not on file  Stress:   . Feeling of Stress : Not on file  Social Connections:   . Frequency of Communication with Friends and Family: Not on file  . Frequency of Social Gatherings with Friends and Family: Not on file  . Attends Religious Services: Not on file  . Active Member of Clubs or Organizations: Not on file  . Attends Banker Meetings: Not on file  . Marital Status: Not on file  Intimate Partner Violence:   . Fear of Current or Ex-Partner: Not on file  . Emotionally Abused: Not on file  . Physically Abused: Not on file  . Sexually Abused: Not on file      Review of systems: All other review of systems negative except as mentioned in the HPI.   Physical Exam: Vitals:   03/29/20 1336  BP: (!) 118/58  Pulse: 77  SpO2: 96%   Body mass index is 23.08 kg/m. Gen:      No acute distress HEENT:  sclera anicteric Neuro: alert and oriented x 3 Psych: normal mood and affect  Data Reviewed:  Reviewed labs, radiology imaging, old records and pertinent past GI work up   Assessment and Plan/Recommendations:  86 year old very pleasant female with history of chronic GERD, dysphagia associated with significant weight loss  EGD with findings suggestive of reflux esophagitis and ulceration, dilation was not performed at the time We will schedule for repeat EGD to document mucosal healing of ulcers and plan for esophageal dilation as  needed The risks and benefits as well as alternatives of endoscopic procedure(s) have been discussed and reviewed. All questions answered. The patient agrees to proceed.  Continue Prevacid daily Use Carafate before meals and at bedtime as needed Continue antireflux measures and lifestyle modifications  Advised patient to avoid crusty bread, meat and raw vegetables until EGD with esophageal dilation  Idiopathic constipation: Add Benefiber 1 teaspoon 3 times daily with meals Increase dietary fiber and fluid intake   The patient was provided an  opportunity to ask questions and all were answered. The patient agreed with the plan and demonstrated an understanding of the instructions.  Iona Beard , MD    CC: Georgianne Fick, MD

## 2020-03-31 ENCOUNTER — Other Ambulatory Visit: Payer: Self-pay | Admitting: Gastroenterology

## 2020-03-31 DIAGNOSIS — R634 Abnormal weight loss: Secondary | ICD-10-CM

## 2020-03-31 DIAGNOSIS — R1319 Other dysphagia: Secondary | ICD-10-CM

## 2020-03-31 DIAGNOSIS — K222 Esophageal obstruction: Secondary | ICD-10-CM

## 2020-04-01 ENCOUNTER — Encounter: Payer: Self-pay | Admitting: Gastroenterology

## 2020-05-03 ENCOUNTER — Other Ambulatory Visit: Payer: Self-pay

## 2020-05-03 ENCOUNTER — Ambulatory Visit (AMBULATORY_SURGERY_CENTER): Payer: Medicare PPO | Admitting: Gastroenterology

## 2020-05-03 ENCOUNTER — Encounter: Payer: Self-pay | Admitting: Gastroenterology

## 2020-05-03 VITALS — BP 123/58 | HR 62 | Temp 96.6°F | Resp 20 | Ht 66.0 in | Wt 143.0 lb

## 2020-05-03 DIAGNOSIS — B3781 Candidal esophagitis: Secondary | ICD-10-CM

## 2020-05-03 DIAGNOSIS — K2100 Gastro-esophageal reflux disease with esophagitis, without bleeding: Secondary | ICD-10-CM | POA: Diagnosis not present

## 2020-05-03 DIAGNOSIS — K449 Diaphragmatic hernia without obstruction or gangrene: Secondary | ICD-10-CM

## 2020-05-03 DIAGNOSIS — R1310 Dysphagia, unspecified: Secondary | ICD-10-CM

## 2020-05-03 DIAGNOSIS — I10 Essential (primary) hypertension: Secondary | ICD-10-CM | POA: Diagnosis not present

## 2020-05-03 DIAGNOSIS — K222 Esophageal obstruction: Secondary | ICD-10-CM | POA: Diagnosis not present

## 2020-05-03 MED ORDER — PANTOPRAZOLE SODIUM 40 MG PO TBEC: 40.0000 mg | DELAYED_RELEASE_TABLET | Freq: Every day | ORAL | 6 refills | Status: DC

## 2020-05-03 MED ORDER — SODIUM CHLORIDE 0.9 % IV SOLN: 500.0000 mL | Freq: Once | INTRAVENOUS | Status: DC

## 2020-05-03 MED ORDER — FLUCONAZOLE 100 MG PO TABS: 100.0000 mg | ORAL_TABLET | Freq: Every day | ORAL | 0 refills | Status: DC

## 2020-05-03 MED ORDER — PANTOPRAZOLE SODIUM 40 MG PO TBEC: 40.0000 mg | DELAYED_RELEASE_TABLET | Freq: Two times a day (BID) | ORAL | 2 refills | Status: DC

## 2020-05-03 NOTE — Progress Notes (Signed)
1027 Robinul 0.1 mg IV given due large amount of secretions upon assessment.  MD made aware, vss  

## 2020-05-03 NOTE — Progress Notes (Signed)
AR - Check-in CW- VS  

## 2020-05-03 NOTE — Op Note (Signed)
[facility] Patient Name: Dominique SimmondsCordie Simmons Procedure Date: 05/03/2020 10:16 AM MRN: [id number] Endoscopist: Dominique Simmons , MD Age: 86 Referring MD:  Date of Birth: [birth year] Gender: Female Account #: [id number] Procedure:                Upper GI endoscopy Indications:              Dysphagia, Esophageal reflux Medicines:                Monitored Anesthesia Care Procedure:                Pre-Anesthesia Assessment:                           - Prior to the procedure, a History and Physical                            was performed, and patient medications and                            allergies were reviewed. The patient's tolerance of                            previous anesthesia was also reviewed. The risks                            and benefits of the procedure and the sedation                            options and risks were discussed with the patient.                            All questions were answered, and informed consent                            was obtained. Prior Anticoagulants: The patient has                            taken no previous anticoagulant or antiplatelet                            agents. ASA Grade Assessment: II - A patient with                            mild systemic disease. After reviewing the risks                            and benefits, the patient was deemed in                            satisfactory condition to undergo the procedure.                           After obtaining informed consent, the endoscope was  passed under direct vision. Throughout the                            procedure, the patient's blood pressure, pulse, and                            oxygen saturations were monitored continuously. The                            Endoscope was introduced through the mouth, and                            advanced to the second part of duodenum. The upper                            GI endoscopy was  accomplished without difficulty.                            The patient tolerated the procedure well. Scope In: Scope Out: Findings:                 Esophagitis was found 30 to 38 cm from the                            incisors. Biopsies were taken with a cold forceps                            for histology.                           LA Grade B (one or more mucosal breaks greater than                            5 mm, not extending between the tops of two mucosal                            folds) esophagitis was found 36 to 38 cm from the                            incisors.                           One benign-appearing, intrinsic moderate                            (circumferential scarring or stenosis; an endoscope                            may pass) stenosis was found 36 to 37 cm from the                            incisors. This stenosis measured 1.6 cm (inner  diameter) x 1 cm (in length). The stenosis was                            traversed. A TTS dilator was passed through the                            scope. Dilation with an 18-19-20 mm balloon dilator                            was performed to 18 mm.                           Patchy mildly erythematous mucosa was found in the                            gastric antrum.                           A small hiatal hernia was present.                           The stomach was normal otherwise.                           The examined duodenum was normal. Complications:            No immediate complications. Estimated Blood Loss:     Estimated blood loss was minimal. Impression:               - Candidiasis esophagitis. Biopsied.                           - LA Grade B reflux esophagitis.                           - Benign-appearing esophageal stenosis. Dilated.                           - Erythematous mucosa in the antrum.                           - Small hiatal hernia.                           - Normal  stomach.                           - Normal examined duodenum. Recommendation:           - Patient has a contact number available for                            emergencies. The signs and symptoms of potential                            delayed complications were discussed with the  patient. Return to normal activities tomorrow.                            Written discharge instructions were provided to the                            patient.                           - Clear liquid diet today, then advance as                            tolerated to soft diet.                           - Continue present medications.                           - Await pathology results.                           - Use Protonix (pantoprazole) 40 mg PO BID for 3                            months then use Protonix 40mg  daily.                           - Diflucan (fluconazole) 100 mg PO daily for 10                            days.                           - Follow an antireflux regimen.                           - Return to GI office in 3 months. Please call to                            schedule appointment , MD 05/03/2020 10:43:41 AM This report has been signed electronically.

## 2020-05-03 NOTE — Patient Instructions (Addendum)
HANDOUTS PROVIDED ON: Esophagitis, Post Esophageal Dilation Diet and Anti-reflux regimen  The biopsies taken today have been sent for pathology.  The results can take 1-3 weeks to receive.    Protonix 40 mg by mouth twice a day for 3 months, then 40 mg daily.  Diflucan 100 mg by mouth daily for 10 days.    See handout for diet and you may resume your normal medication schedule.  Return to the GI office in 3 months.  Please call to schedule appointment  Thank you for allowing [place] to care for you today!!!      YOU HAD AN ENDOSCOPIC PROCEDURE TODAY AT THE [facility]:   Refer to the procedure report that was given to you for any specific questions about what was found during the examination.  If the procedure report does not answer your questions, please call your gastroenterologist to clarify.  If you requested that your care partner not be given the details of your procedure findings, then the procedure report has been included in a sealed envelope for you to review at your convenience later.  YOU SHOULD EXPECT: Some feelings of bloating in the abdomen. Passage of more gas than usual.  Walking can help get rid of the air that was put into your GI tract during the procedure and reduce the bloating. If you had a lower endoscopy (such as a colonoscopy or flexible sigmoidoscopy) you may notice spotting of blood in your stool or on the toilet paper. If you underwent a bowel prep for your procedure, you may not have a normal bowel movement for a few days.  Please Note:  You might notice some irritation and congestion in your nose or some drainage.  This is from the oxygen used during your procedure.  There is no need for concern and it should clear up in a day or so.  SYMPTOMS TO REPORT IMMEDIATELY:   Following upper endoscopy (EGD)  Vomiting of blood or coffee ground material  New chest pain or pain under the shoulder blades  Painful or persistently difficult swallowing  New  shortness of breath  Fever of 100F or higher  Black, tarry-looking stools  For urgent or emergent issues, a gastroenterologist can be reached at any hour by calling [phone]. Do not use MyChart messaging for urgent concerns.    DIET:  Clear liquid diet today then advance as tolerated to soft diet (see handout).  Drink plenty of fluids but you should avoid alcoholic beverages for 24 hours.  ACTIVITY:  You should plan to take it easy for the rest of today and you should NOT DRIVE or use heavy machinery until tomorrow (because of the sedation medicines used during the test).    FOLLOW UP: Our staff will call the number listed on your records 48-72 hours following your procedure to check on you and address any questions or concerns that you may have regarding the information given to you following your procedure. If we do not reach you, we will leave a message.  We will attempt to reach you two times.  During this call, we will ask if you have developed any symptoms of COVID 19. If you develop any symptoms (ie: fever, flu-like symptoms, shortness of breath, cough etc.) before then, please call [phone].  If you test positive for Covid 19 in the 2 weeks post procedure, please call and report this information to [place].    If any biopsies were taken you will be contacted by phone  or by letter within the next 1-3 weeks.  Please call us at [phone] if you have not heard about the biopsies in 3 weeks.    SIGNATURES/CONFIDENTIALITY: You and/or your care partner have signed paperwork which will be entered into your electronic medical record.  These signatures attest to the fact that that the information above on your After Visit Summary has been reviewed and is understood.  Full responsibility of the confidentiality of this discharge information lies with you and/or your care-partner.

## 2020-05-03 NOTE — Progress Notes (Signed)
Called to room to assist during endoscopic procedure.  Patient ID and intended procedure confirmed with present staff. Received instructions for my participation in the procedure from the performing physician.  

## 2020-05-03 NOTE — Progress Notes (Signed)
Report given to PACU, vss 

## 2020-05-05 ENCOUNTER — Telehealth: Payer: Self-pay | Admitting: *Deleted

## 2020-05-05 NOTE — Telephone Encounter (Signed)
°  Follow up Call-  Call back number 05/03/2020 02/01/2020  Post procedure Call Back phone  # #[phone] hm [phone]  Permission to leave phone message Yes Yes  Some recent data might be hidden     Patient questions:  Do you have a fever, pain , or abdominal swelling? No. Pain Score  0 *  Have you tolerated food without any problems? Yes.    Have you been able to return to your normal activities? Yes.    Do you have any questions about your discharge instructions: Diet   No. Medications  No. Follow up visit  No.  Do you have questions or concerns about your Care? No.  Actions: * If pain score is 4 or above: No action needed, pain <4.  1. Have you developed a fever since your procedure? no  2.   Have you had an respiratory symptoms (SOB or cough) since your procedure? no  3.   Have you tested positive for COVID 19 since your procedure no  4.   Have you had any family members/close contacts diagnosed with the COVID 19 since your procedure?  no   If yes to any of these questions please route to Laverna Peace, RN and Karlton Lemon, RN

## 2020-05-10 DIAGNOSIS — E119 Type 2 diabetes mellitus without complications: Secondary | ICD-10-CM | POA: Diagnosis not present

## 2020-05-23 ENCOUNTER — Encounter: Payer: Self-pay | Admitting: Gastroenterology

## 2020-05-23 DIAGNOSIS — E1165 Type 2 diabetes mellitus with hyperglycemia: Secondary | ICD-10-CM | POA: Diagnosis not present

## 2020-05-23 DIAGNOSIS — E782 Mixed hyperlipidemia: Secondary | ICD-10-CM | POA: Diagnosis not present

## 2020-05-23 DIAGNOSIS — G609 Hereditary and idiopathic neuropathy, unspecified: Secondary | ICD-10-CM | POA: Diagnosis not present

## 2020-05-23 DIAGNOSIS — I129 Hypertensive chronic kidney disease with stage 1 through stage 4 chronic kidney disease, or unspecified chronic kidney disease: Secondary | ICD-10-CM | POA: Diagnosis not present

## 2020-05-23 DIAGNOSIS — E1121 Type 2 diabetes mellitus with diabetic nephropathy: Secondary | ICD-10-CM | POA: Diagnosis not present

## 2020-05-23 DIAGNOSIS — N182 Chronic kidney disease, stage 2 (mild): Secondary | ICD-10-CM | POA: Diagnosis not present

## 2020-05-30 DIAGNOSIS — E782 Mixed hyperlipidemia: Secondary | ICD-10-CM | POA: Diagnosis not present

## 2020-05-30 DIAGNOSIS — R531 Weakness: Secondary | ICD-10-CM | POA: Diagnosis not present

## 2020-05-30 DIAGNOSIS — H538 Other visual disturbances: Secondary | ICD-10-CM | POA: Diagnosis not present

## 2020-05-30 DIAGNOSIS — N182 Chronic kidney disease, stage 2 (mild): Secondary | ICD-10-CM | POA: Diagnosis not present

## 2020-05-30 DIAGNOSIS — E1165 Type 2 diabetes mellitus with hyperglycemia: Secondary | ICD-10-CM | POA: Diagnosis not present

## 2020-05-30 DIAGNOSIS — G609 Hereditary and idiopathic neuropathy, unspecified: Secondary | ICD-10-CM | POA: Diagnosis not present

## 2020-06-01 DIAGNOSIS — E1165 Type 2 diabetes mellitus with hyperglycemia: Secondary | ICD-10-CM | POA: Diagnosis not present

## 2020-06-01 DIAGNOSIS — R739 Hyperglycemia, unspecified: Secondary | ICD-10-CM | POA: Diagnosis not present

## 2020-06-01 DIAGNOSIS — R531 Weakness: Secondary | ICD-10-CM | POA: Diagnosis not present

## 2020-06-07 DIAGNOSIS — E1165 Type 2 diabetes mellitus with hyperglycemia: Secondary | ICD-10-CM | POA: Diagnosis not present

## 2020-06-07 DIAGNOSIS — R531 Weakness: Secondary | ICD-10-CM | POA: Diagnosis not present

## 2020-06-07 DIAGNOSIS — R739 Hyperglycemia, unspecified: Secondary | ICD-10-CM | POA: Diagnosis not present

## 2020-06-28 DIAGNOSIS — Z794 Long term (current) use of insulin: Secondary | ICD-10-CM | POA: Diagnosis not present

## 2020-06-28 DIAGNOSIS — E782 Mixed hyperlipidemia: Secondary | ICD-10-CM | POA: Diagnosis not present

## 2020-06-28 DIAGNOSIS — E1165 Type 2 diabetes mellitus with hyperglycemia: Secondary | ICD-10-CM | POA: Diagnosis not present

## 2020-07-01 ENCOUNTER — Ambulatory Visit: Payer: Medicare PPO | Admitting: Gastroenterology

## 2020-07-01 ENCOUNTER — Telehealth: Payer: Self-pay | Admitting: Gastroenterology

## 2020-07-01 ENCOUNTER — Encounter: Payer: Self-pay | Admitting: Gastroenterology

## 2020-07-01 VITALS — BP 130/80 | HR 86 | Ht 66.0 in | Wt 138.0 lb

## 2020-07-01 DIAGNOSIS — K219 Gastro-esophageal reflux disease without esophagitis: Secondary | ICD-10-CM | POA: Diagnosis not present

## 2020-07-01 DIAGNOSIS — K222 Esophageal obstruction: Secondary | ICD-10-CM

## 2020-07-01 MED ORDER — PANTOPRAZOLE SODIUM 20 MG PO TBEC: 20.0000 mg | DELAYED_RELEASE_TABLET | Freq: Every day | ORAL | 11 refills | Status: DC

## 2020-07-01 NOTE — Telephone Encounter (Signed)
Pt called to let you know the name of the insulin that she uses. The name is toujeo max.

## 2020-07-01 NOTE — Patient Instructions (Addendum)
We have sent Protonix to your pharmacy   [employer] Current Therapy 2021 (pp. 213-216). [place], [place]: Elsevier.">  Gastroesophageal Reflux Disease, Adult Gastroesophageal reflux (GER) happens when acid from the stomach flows up into the tube that connects the mouth and the stomach (esophagus). Normally, food travels down the esophagus and stays in the stomach to be digested. However, when a person has GER, food and stomach acid sometimes move back up into the esophagus. If this becomes a more serious problem, the person may be diagnosed with a disease called gastroesophageal reflux disease (GERD). GERD occurs when the reflux:  Happens often.  Causes frequent or severe symptoms.  Causes problems such as damage to the esophagus. When stomach acid comes in contact with the esophagus, the acid may cause inflammation in the esophagus. Over time, GERD may create small holes (ulcers) in the lining of the esophagus. What are the causes? This condition is caused by a problem with the muscle between the esophagus and the stomach (lower esophageal sphincter, or LES). Normally, the LES muscle closes after food passes through the esophagus to the stomach. When the LES is weakened or abnormal, it does not close properly, and that allows food and stomach acid to go back up into the esophagus. The LES can be weakened by certain dietary substances, medicines, and medical conditions, including:  Tobacco use.  Pregnancy.  Having a hiatal hernia.  Alcohol use.  Certain foods and beverages, such as coffee, chocolate, onions, and peppermint. What increases the risk? You are more likely to develop this condition if you:  Have an increased body weight.  Have a connective tissue disorder.  Take NSAIDs, such as ibuprofen. What are the signs or symptoms? Symptoms of this condition include:  Heartburn.  Difficult or painful swallowing and the feeling of having a lump in the throat.  A bitter taste in  the mouth.  Bad breath and having a large amount of saliva.  Having an upset or bloated stomach and belching.  Chest pain. Different conditions can cause chest pain. Make sure you see your health care provider if you experience chest pain.  Shortness of breath or wheezing.  Ongoing (chronic) cough or a nighttime cough.  Wearing away of tooth enamel.  Weight loss. How is this diagnosed? This condition may be diagnosed based on a medical history and a physical exam. To determine if you have mild or severe GERD, your health care provider may also monitor how you respond to treatment. You may also have tests, including:  A test to examine your stomach and esophagus with a small camera (endoscopy).  A test that measures the acidity level in your esophagus.  A test that measures how much pressure is on your esophagus.  A barium swallow or modified barium swallow test to show the shape, size, and functioning of your esophagus. How is this treated? Treatment for this condition may vary depending on how severe your symptoms are. Your health care provider may recommend:  Changes to your diet.  Medicine.  Surgery. The goal of treatment is to help relieve your symptoms and to prevent complications. Follow these instructions at home: Eating and drinking  Follow a diet as recommended by your health care provider. This may involve avoiding foods and drinks such as: ? Coffee and tea, with or without caffeine. ? Drinks that contain alcohol. ? Energy drinks and sports drinks. ? Carbonated drinks or sodas. ? Chocolate and cocoa. ? Peppermint and mint flavorings. ? Garlic and onions. ? Horseradish. ?  Spicy and acidic foods, including peppers, chili powder, curry powder, vinegar, hot sauces, and barbecue sauce. ? Citrus fruit juices and citrus fruits, such as oranges, lemons, and limes. ? Tomato-based foods, such as red sauce, chili, salsa, and pizza with red sauce. ? Fried and fatty  foods, such as donuts, french fries, potato chips, and high-fat dressings. ? High-fat meats, such as hot dogs and fatty cuts of red and white meats, such as rib eye steak, sausage, ham, and bacon. ? High-fat dairy items, such as whole milk, butter, and cream cheese.  Eat small, frequent meals instead of large meals.  Avoid drinking large amounts of liquid with your meals.  Avoid eating meals during the 2-3 hours before bedtime.  Avoid lying down right after you eat.  Do not exercise right after you eat.   Lifestyle  Do not use any products that contain nicotine or tobacco. These products include cigarettes, chewing tobacco, and vaping devices, such as e-cigarettes. If you need help quitting, ask your health care provider.  Try to reduce your stress by using methods such as yoga or meditation. If you need help reducing stress, ask your health care provider.  If you are overweight, reduce your weight to an amount that is healthy for you. Ask your health care provider for guidance about a safe weight loss goal.   General instructions  Pay attention to any changes in your symptoms.  Take over-the-counter and prescription medicines only as told by your health care provider. Do not take aspirin, ibuprofen, or other NSAIDs unless your health care provider told you to take these medicines.  Wear loose-fitting clothing. Do not wear anything tight around your waist that causes pressure on your abdomen.  Raise (elevate) the head of your bed about 6 inches (15 cm). You can use a wedge to do this.  Avoid bending over if this makes your symptoms worse.  Keep all follow-up visits. This is important. Contact a health care provider if:  You have: ? New symptoms. ? Unexplained weight loss. ? Difficulty swallowing or it hurts to swallow. ? Wheezing or a persistent cough. ? A hoarse voice.  Your symptoms do not improve with treatment. Get help right away if:  You have sudden pain in your  arms, neck, jaw, teeth, or back.  You suddenly feel sweaty, dizzy, or light-headed.  You have chest pain or shortness of breath.  You vomit and the vomit is green, yellow, or black, or it looks like blood or coffee grounds.  You faint.  You have stool that is red, bloody, or black.  You cannot swallow, drink, or eat. These symptoms may represent a serious problem that is an emergency. Do not wait to see if the symptoms will go away. Get medical help right away. Call your local emergency services (911 in the [place] Do not drive yourself to the hospital. Summary  Gastroesophageal reflux happens when acid from the stomach flows up into the esophagus. GERD is a disease in which the reflux happens often, causes frequent or severe symptoms, or causes problems such as damage to the esophagus.  Treatment for this condition may vary depending on how severe your symptoms are. Your health care provider may recommend diet and lifestyle changes, medicine, or surgery.  Contact a health care provider if you have new or worsening symptoms.  Take over-the-counter and prescription medicines only as told by your health care provider. Do not take aspirin, ibuprofen, or other NSAIDs unless your health care provider told  you to do so.  Keep all follow-up visits as told by your health care provider. This is important. This information is not intended to replace advice given to you by your health care provider. Make sure you discuss any questions you have with your health care provider. Document Revised: 12/14/2019 Document Reviewed: 12/14/2019 Elsevier Patient Education  2021 [employer] Inc.  Follow up as needed  I appreciate the  opportunity to care for you  Thank You   Marsa Aris , MD

## 2020-07-01 NOTE — Progress Notes (Signed)
Dominique Simmons    [id number]    1933-11-20  Primary Care Physician:Ramachandran, Campbell Lerner, MD  Referring Physician: Georgianne Fick, MD [address] [place],  [place] [address]   Chief complaint:  Dysphagia, GERD  HPI:  87 year old very pleasant female here for follow-up visit for GERD and dysphagia  EGD May 03, 2020: LA grade B reflux esophagitis, Candida esophagitis, benign-appearing esophageal stricture status post dilation with TTS balloon to 18 mm  EGD February 01, 2020: Severe esophageal stenosis with cratered ulcers in the distal esophagus, was unable to traverse through the stricture  Patient stopped metformin, after EGD when I advised patient to discuss regarding metformin with her primary care physician, because she was having difficulty swallowing it, may have caused stasis ulceration in the distal esophagus with the medication not traversing through the EG junction. She was under the impression that she can come off the medication.  She says she called Dr. Carolyn Stare office and was advised to stop metformin but she was not started on alternative medication and her blood sugars were significantly high subsequently. Since then she has been started on insulin, she feels much better and also her blood sugars are currently well controlled with insulin.  She is reluctant to restart metformin  She is no longer having dysphagia, currently not on any PPI or H2 blocker Denies any nausea, vomiting, abdominal pain, melena or bright red blood per rectum  She is no longer losing weight but has not regained weight either.  Outpatient Encounter Medications as of 07/01/2020  Medication Sig  . acetaminophen (TYLENOL) 325 MG tablet Take 650 mg by mouth every 6 (six) hours as needed.  Marland Kitchen amLODipine (NORVASC) 5 MG tablet Take 5 mg by mouth daily.  Marland Kitchen aspirin 81 MG tablet Take 81 mg by mouth daily.  . clotrimazole-betamethasone (LOTRISONE) cream   .  hydrochlorothiazide (HYDRODIURIL) 25 MG tablet Take 25 mg by mouth daily.  Boris Lown Oil 500 MG CAPS Take by mouth.  . losartan (COZAAR) 25 MG tablet Take 25 mg by mouth daily.  . pantoprazole (PROTONIX) 40 MG tablet Take 1 tablet (40 mg total) by mouth daily.  . [DISCONTINUED] sucralfate (CARAFATE) 1 GM/10ML suspension TAKE 10 MLS BY MOUTH 4 (FOUR) TIMES DAILY.  . [DISCONTINUED] fluconazole (DIFLUCAN) 100 MG tablet Take 1 tablet (100 mg total) by mouth daily.  . [DISCONTINUED] metFORMIN (GLUCOPHAGE) 500 MG tablet   . [DISCONTINUED] pantoprazole (PROTONIX) 40 MG tablet Take 1 tablet (40 mg total) by mouth 2 (two) times daily.  . [DISCONTINUED] sucralfate (CARAFATE) 1 g tablet Take 1 tablet (1 g total) by mouth 3 (three) times daily. as needed (Patient not taking: Reported on 05/03/2020)  . [DISCONTINUED] 0.9 %  sodium chloride infusion    No facility-administered encounter medications on file as of 07/01/2020.    Allergies as of 07/01/2020 - Review Complete 07/01/2020  Allergen Reaction Noted  . Amoxicillin  04/04/2010  . Lescol [fluvastatin sodium]  02/26/2017  . Lipitor [atorvastatin]  02/26/2017  . Lisinopril  02/26/2017  . Pravachol [pravastatin sodium]  02/26/2017    Past Medical History:  Diagnosis Date  . Arthritis   . Cataract    BEGINING  . Diabetes mellitus without complication (HCC)    pt d/c Metformin August 2021 due to esophageal ulcers  . Esophageal ring 11/2011  . GERD (gastroesophageal reflux disease)   . Hyperlipidemia   . Hypertension   . IBS (irritable bowel syndrome)   .  Sliding hiatal hernia 11/2011    Past Surgical History:  Procedure Laterality Date  . COLONOSCOPY    . KNEE ARTHROSCOPY  11/2006   right  . ROTATOR CUFF REPAIR     left  . TONSILLECTOMY    . TOTAL ABDOMINAL HYSTERECTOMY  1980  . UPPER GASTROINTESTINAL ENDOSCOPY    . VEIN LIGATION AND STRIPPING  2003   bilateral  . WRIST SURGERY     left    Family History  Problem Relation Age of  Onset  . Coronary artery disease Mother   . Heart disease Mother   . Coronary artery disease Father   . Heart disease Father   . Coronary artery disease Sister   . Heart attack Sister   . Diabetes Brother   . Colon cancer Neg Hx   . Esophageal cancer Neg Hx   . Rectal cancer Neg Hx   . Stomach cancer Neg Hx     Social History   Socioeconomic History  . Marital status: Married    Spouse name: Not on file  . Number of children: 2  . Years of education: Not on file  . Highest education level: Not on file  Occupational History  . Occupation: retired    Associate Professor: RETIRED  Tobacco Use  . Smoking status: Never Smoker  . Smokeless tobacco: Never Used  Vaping Use  . Vaping Use: Never used  Substance and Sexual Activity  . Alcohol use: No  . Drug use: No  . Sexual activity: Not on file  Other Topics Concern  . Not on file  Social History Narrative  . Not on file   Social Determinants of Health   Financial Resource Strain: Not on file  Food Insecurity: Not on file  Transportation Needs: Not on file  Physical Activity: Not on file  Stress: Not on file  Social Connections: Not on file  Intimate Partner Violence: Not on file      Review of systems: All other review of systems negative except as mentioned in the HPI.   Physical Exam: Vitals:   07/01/20 1503  BP: 130/80  Pulse: 86   Body mass index is 22.27 kg/m. Gen:      No acute distress Neuro: alert and oriented x 3 Psych: normal mood and affect  Data Reviewed:  Reviewed labs, radiology imaging, old records and pertinent past GI work up   Assessment and Plan/Recommendations:  87 year old very pleasant female with chronic GERD, esophageal stricture, Candida esophagitis, s/p EGD with esophageal dilation  Dysphagia secondary to esophageal stricture: Improved s/p EGD with dilation  GERD: Currently not on any acid suppressive medication.  Encourage patient to restart taking low-dose Protonix to prevent  persistent esophageal acid exposure and recurrent stricture Start Protonix 20 mg daily Discussed antireflux measures  Encouraged patient to discuss in detail her diabetes medication management with Dr. Nicholos Johns and avoid stopping any medication without discussing with him  Return as needed  This visit required 40 minutes of patient care (this includes precharting, chart review, review of results, face-to-face time used for counseling as well as treatment plan and follow-up. The patient was provided an opportunity to ask questions and all were answered. The patient agreed with the plan and demonstrated an understanding of the instructions.  Iona Beard , MD    CC: Georgianne Fick, MD

## 2020-07-01 NOTE — Telephone Encounter (Signed)
Ok noted  

## 2020-08-02 DIAGNOSIS — Z794 Long term (current) use of insulin: Secondary | ICD-10-CM | POA: Diagnosis not present

## 2020-08-02 DIAGNOSIS — E1165 Type 2 diabetes mellitus with hyperglycemia: Secondary | ICD-10-CM | POA: Diagnosis not present

## 2020-08-02 DIAGNOSIS — E782 Mixed hyperlipidemia: Secondary | ICD-10-CM | POA: Diagnosis not present

## 2020-08-09 DIAGNOSIS — E1121 Type 2 diabetes mellitus with diabetic nephropathy: Secondary | ICD-10-CM | POA: Diagnosis not present

## 2020-08-09 DIAGNOSIS — N182 Chronic kidney disease, stage 2 (mild): Secondary | ICD-10-CM | POA: Diagnosis not present

## 2020-08-09 DIAGNOSIS — E782 Mixed hyperlipidemia: Secondary | ICD-10-CM | POA: Diagnosis not present

## 2020-08-30 DIAGNOSIS — Z794 Long term (current) use of insulin: Secondary | ICD-10-CM | POA: Diagnosis not present

## 2020-08-30 DIAGNOSIS — E1165 Type 2 diabetes mellitus with hyperglycemia: Secondary | ICD-10-CM | POA: Diagnosis not present

## 2020-08-30 DIAGNOSIS — N182 Chronic kidney disease, stage 2 (mild): Secondary | ICD-10-CM | POA: Diagnosis not present

## 2020-08-30 DIAGNOSIS — E782 Mixed hyperlipidemia: Secondary | ICD-10-CM | POA: Diagnosis not present

## 2020-11-01 DIAGNOSIS — E782 Mixed hyperlipidemia: Secondary | ICD-10-CM | POA: Diagnosis not present

## 2020-11-01 DIAGNOSIS — E1165 Type 2 diabetes mellitus with hyperglycemia: Secondary | ICD-10-CM | POA: Diagnosis not present

## 2020-11-08 DIAGNOSIS — N182 Chronic kidney disease, stage 2 (mild): Secondary | ICD-10-CM | POA: Diagnosis not present

## 2020-11-08 DIAGNOSIS — E1165 Type 2 diabetes mellitus with hyperglycemia: Secondary | ICD-10-CM | POA: Diagnosis not present

## 2020-11-08 DIAGNOSIS — G609 Hereditary and idiopathic neuropathy, unspecified: Secondary | ICD-10-CM | POA: Diagnosis not present

## 2020-11-08 DIAGNOSIS — E782 Mixed hyperlipidemia: Secondary | ICD-10-CM | POA: Diagnosis not present

## 2020-11-08 DIAGNOSIS — Z794 Long term (current) use of insulin: Secondary | ICD-10-CM | POA: Diagnosis not present

## 2021-02-15 DIAGNOSIS — Z794 Long term (current) use of insulin: Secondary | ICD-10-CM | POA: Diagnosis not present

## 2021-02-15 DIAGNOSIS — N182 Chronic kidney disease, stage 2 (mild): Secondary | ICD-10-CM | POA: Diagnosis not present

## 2021-02-15 DIAGNOSIS — E1165 Type 2 diabetes mellitus with hyperglycemia: Secondary | ICD-10-CM | POA: Diagnosis not present

## 2021-02-15 DIAGNOSIS — G609 Hereditary and idiopathic neuropathy, unspecified: Secondary | ICD-10-CM | POA: Diagnosis not present

## 2021-02-15 DIAGNOSIS — E782 Mixed hyperlipidemia: Secondary | ICD-10-CM | POA: Diagnosis not present

## 2021-02-22 DIAGNOSIS — Z0000 Encounter for general adult medical examination without abnormal findings: Secondary | ICD-10-CM | POA: Diagnosis not present

## 2021-02-22 DIAGNOSIS — I129 Hypertensive chronic kidney disease with stage 1 through stage 4 chronic kidney disease, or unspecified chronic kidney disease: Secondary | ICD-10-CM | POA: Diagnosis not present

## 2021-02-22 DIAGNOSIS — E782 Mixed hyperlipidemia: Secondary | ICD-10-CM | POA: Diagnosis not present

## 2021-02-22 DIAGNOSIS — G720 Drug-induced myopathy: Secondary | ICD-10-CM | POA: Diagnosis not present

## 2021-02-22 DIAGNOSIS — T466X5A Adverse effect of antihyperlipidemic and antiarteriosclerotic drugs, initial encounter: Secondary | ICD-10-CM | POA: Diagnosis not present

## 2021-02-22 DIAGNOSIS — G609 Hereditary and idiopathic neuropathy, unspecified: Secondary | ICD-10-CM | POA: Diagnosis not present

## 2021-02-22 DIAGNOSIS — N182 Chronic kidney disease, stage 2 (mild): Secondary | ICD-10-CM | POA: Diagnosis not present

## 2021-02-22 DIAGNOSIS — Z794 Long term (current) use of insulin: Secondary | ICD-10-CM | POA: Diagnosis not present

## 2021-02-22 DIAGNOSIS — E1121 Type 2 diabetes mellitus with diabetic nephropathy: Secondary | ICD-10-CM | POA: Diagnosis not present

## 2021-05-31 DIAGNOSIS — N3001 Acute cystitis with hematuria: Secondary | ICD-10-CM | POA: Diagnosis not present

## 2021-05-31 DIAGNOSIS — N952 Postmenopausal atrophic vaginitis: Secondary | ICD-10-CM | POA: Diagnosis not present

## 2021-05-31 DIAGNOSIS — Z01419 Encounter for gynecological examination (general) (routine) without abnormal findings: Secondary | ICD-10-CM | POA: Diagnosis not present

## 2021-05-31 DIAGNOSIS — R300 Dysuria: Secondary | ICD-10-CM | POA: Diagnosis not present

## 2021-06-28 DIAGNOSIS — E1121 Type 2 diabetes mellitus with diabetic nephropathy: Secondary | ICD-10-CM | POA: Diagnosis not present

## 2021-06-28 DIAGNOSIS — Z0000 Encounter for general adult medical examination without abnormal findings: Secondary | ICD-10-CM | POA: Diagnosis not present

## 2021-06-28 DIAGNOSIS — N182 Chronic kidney disease, stage 2 (mild): Secondary | ICD-10-CM | POA: Diagnosis not present

## 2021-07-05 DIAGNOSIS — G609 Hereditary and idiopathic neuropathy, unspecified: Secondary | ICD-10-CM | POA: Diagnosis not present

## 2021-07-05 DIAGNOSIS — G720 Drug-induced myopathy: Secondary | ICD-10-CM | POA: Diagnosis not present

## 2021-07-05 DIAGNOSIS — E782 Mixed hyperlipidemia: Secondary | ICD-10-CM | POA: Diagnosis not present

## 2021-07-05 DIAGNOSIS — T466X5A Adverse effect of antihyperlipidemic and antiarteriosclerotic drugs, initial encounter: Secondary | ICD-10-CM | POA: Diagnosis not present

## 2021-07-05 DIAGNOSIS — Z794 Long term (current) use of insulin: Secondary | ICD-10-CM | POA: Diagnosis not present

## 2021-07-05 DIAGNOSIS — I129 Hypertensive chronic kidney disease with stage 1 through stage 4 chronic kidney disease, or unspecified chronic kidney disease: Secondary | ICD-10-CM | POA: Diagnosis not present

## 2021-07-05 DIAGNOSIS — E1121 Type 2 diabetes mellitus with diabetic nephropathy: Secondary | ICD-10-CM | POA: Diagnosis not present

## 2021-07-05 DIAGNOSIS — N182 Chronic kidney disease, stage 2 (mild): Secondary | ICD-10-CM | POA: Diagnosis not present

## 2021-09-30 ENCOUNTER — Other Ambulatory Visit: Payer: Self-pay | Admitting: Gastroenterology

## 2021-10-25 DIAGNOSIS — I129 Hypertensive chronic kidney disease with stage 1 through stage 4 chronic kidney disease, or unspecified chronic kidney disease: Secondary | ICD-10-CM | POA: Diagnosis not present

## 2021-10-25 DIAGNOSIS — E782 Mixed hyperlipidemia: Secondary | ICD-10-CM | POA: Diagnosis not present

## 2021-10-25 DIAGNOSIS — Z794 Long term (current) use of insulin: Secondary | ICD-10-CM | POA: Diagnosis not present

## 2021-10-25 DIAGNOSIS — E1121 Type 2 diabetes mellitus with diabetic nephropathy: Secondary | ICD-10-CM | POA: Diagnosis not present

## 2021-10-25 DIAGNOSIS — N182 Chronic kidney disease, stage 2 (mild): Secondary | ICD-10-CM | POA: Diagnosis not present

## 2021-11-06 DIAGNOSIS — Z794 Long term (current) use of insulin: Secondary | ICD-10-CM | POA: Diagnosis not present

## 2021-11-06 DIAGNOSIS — N1831 Chronic kidney disease, stage 3a: Secondary | ICD-10-CM | POA: Diagnosis not present

## 2021-11-06 DIAGNOSIS — G720 Drug-induced myopathy: Secondary | ICD-10-CM | POA: Diagnosis not present

## 2021-11-06 DIAGNOSIS — E1121 Type 2 diabetes mellitus with diabetic nephropathy: Secondary | ICD-10-CM | POA: Diagnosis not present

## 2021-11-06 DIAGNOSIS — E782 Mixed hyperlipidemia: Secondary | ICD-10-CM | POA: Diagnosis not present

## 2021-11-06 DIAGNOSIS — T466X5A Adverse effect of antihyperlipidemic and antiarteriosclerotic drugs, initial encounter: Secondary | ICD-10-CM | POA: Diagnosis not present

## 2021-11-06 DIAGNOSIS — G609 Hereditary and idiopathic neuropathy, unspecified: Secondary | ICD-10-CM | POA: Diagnosis not present

## 2021-11-06 DIAGNOSIS — I129 Hypertensive chronic kidney disease with stage 1 through stage 4 chronic kidney disease, or unspecified chronic kidney disease: Secondary | ICD-10-CM | POA: Diagnosis not present

## 2022-01-22 DIAGNOSIS — R768 Other specified abnormal immunological findings in serum: Secondary | ICD-10-CM | POA: Diagnosis not present

## 2022-01-22 DIAGNOSIS — M150 Primary generalized (osteo)arthritis: Secondary | ICD-10-CM | POA: Diagnosis not present

## 2022-01-22 DIAGNOSIS — M170 Bilateral primary osteoarthritis of knee: Secondary | ICD-10-CM | POA: Diagnosis not present

## 2022-01-22 DIAGNOSIS — M25561 Pain in right knee: Secondary | ICD-10-CM | POA: Diagnosis not present

## 2022-01-22 DIAGNOSIS — E1165 Type 2 diabetes mellitus with hyperglycemia: Secondary | ICD-10-CM | POA: Diagnosis not present

## 2022-02-08 DIAGNOSIS — M170 Bilateral primary osteoarthritis of knee: Secondary | ICD-10-CM | POA: Diagnosis not present

## 2022-02-08 DIAGNOSIS — M25561 Pain in right knee: Secondary | ICD-10-CM | POA: Diagnosis not present

## 2022-02-08 DIAGNOSIS — M25562 Pain in left knee: Secondary | ICD-10-CM | POA: Diagnosis not present

## 2022-02-21 DIAGNOSIS — M170 Bilateral primary osteoarthritis of knee: Secondary | ICD-10-CM | POA: Diagnosis not present

## 2022-03-01 DIAGNOSIS — M170 Bilateral primary osteoarthritis of knee: Secondary | ICD-10-CM | POA: Diagnosis not present

## 2022-03-08 DIAGNOSIS — M25561 Pain in right knee: Secondary | ICD-10-CM | POA: Diagnosis not present

## 2022-03-08 DIAGNOSIS — M25562 Pain in left knee: Secondary | ICD-10-CM | POA: Diagnosis not present

## 2022-03-08 DIAGNOSIS — M170 Bilateral primary osteoarthritis of knee: Secondary | ICD-10-CM | POA: Diagnosis not present

## 2022-03-15 DIAGNOSIS — M170 Bilateral primary osteoarthritis of knee: Secondary | ICD-10-CM | POA: Diagnosis not present

## 2022-04-20 DIAGNOSIS — M25562 Pain in left knee: Secondary | ICD-10-CM | POA: Diagnosis not present

## 2022-06-01 DIAGNOSIS — M1712 Unilateral primary osteoarthritis, left knee: Secondary | ICD-10-CM | POA: Diagnosis not present

## 2022-07-09 DIAGNOSIS — Z01818 Encounter for other preprocedural examination: Secondary | ICD-10-CM | POA: Diagnosis not present

## 2022-07-16 DIAGNOSIS — N182 Chronic kidney disease, stage 2 (mild): Secondary | ICD-10-CM | POA: Diagnosis not present

## 2022-07-16 DIAGNOSIS — Z01818 Encounter for other preprocedural examination: Secondary | ICD-10-CM | POA: Diagnosis not present

## 2022-07-16 DIAGNOSIS — G609 Hereditary and idiopathic neuropathy, unspecified: Secondary | ICD-10-CM | POA: Diagnosis not present

## 2022-07-16 DIAGNOSIS — G720 Drug-induced myopathy: Secondary | ICD-10-CM | POA: Diagnosis not present

## 2022-07-16 DIAGNOSIS — Z794 Long term (current) use of insulin: Secondary | ICD-10-CM | POA: Diagnosis not present

## 2022-07-16 DIAGNOSIS — I129 Hypertensive chronic kidney disease with stage 1 through stage 4 chronic kidney disease, or unspecified chronic kidney disease: Secondary | ICD-10-CM | POA: Diagnosis not present

## 2022-07-16 DIAGNOSIS — E782 Mixed hyperlipidemia: Secondary | ICD-10-CM | POA: Diagnosis not present

## 2022-07-16 DIAGNOSIS — E1121 Type 2 diabetes mellitus with diabetic nephropathy: Secondary | ICD-10-CM | POA: Diagnosis not present

## 2022-07-16 DIAGNOSIS — T466X5A Adverse effect of antihyperlipidemic and antiarteriosclerotic drugs, initial encounter: Secondary | ICD-10-CM | POA: Diagnosis not present

## 2022-07-31 NOTE — Progress Notes (Signed)
Surgery orders requested via Epic inbox. °

## 2022-08-07 ENCOUNTER — Encounter (HOSPITAL_COMMUNITY)
Admission: RE | Admit: 2022-08-07 | Discharge: 2022-08-07 | Disposition: A | Discharge status: Home / Self Care | Payer: Medicare PPO | Source: Ambulatory Visit | Attending: Internal Medicine | Admitting: Internal Medicine

## 2022-08-20 ENCOUNTER — Ambulatory Visit: Admit: 2022-08-20 | Payer: Medicare PPO | Admitting: Orthopedic Surgery

## 2022-08-20 SURGERY — ARTHROPLASTY, KNEE, TOTAL
Anesthesia: Choice | Site: Knee | Laterality: Left

## 2022-09-04 DIAGNOSIS — M1712 Unilateral primary osteoarthritis, left knee: Secondary | ICD-10-CM | POA: Diagnosis not present

## 2022-09-04 DIAGNOSIS — M1711 Unilateral primary osteoarthritis, right knee: Secondary | ICD-10-CM | POA: Diagnosis not present

## 2022-10-11 DIAGNOSIS — M1711 Unilateral primary osteoarthritis, right knee: Secondary | ICD-10-CM | POA: Diagnosis not present

## 2022-11-23 DIAGNOSIS — M1711 Unilateral primary osteoarthritis, right knee: Secondary | ICD-10-CM | POA: Diagnosis not present

## 2022-11-23 DIAGNOSIS — M1712 Unilateral primary osteoarthritis, left knee: Secondary | ICD-10-CM | POA: Diagnosis not present

## 2022-11-23 DIAGNOSIS — M170 Bilateral primary osteoarthritis of knee: Secondary | ICD-10-CM | POA: Diagnosis not present

## 2023-01-03 DIAGNOSIS — E1165 Type 2 diabetes mellitus with hyperglycemia: Secondary | ICD-10-CM | POA: Diagnosis not present

## 2023-01-03 DIAGNOSIS — R5383 Other fatigue: Secondary | ICD-10-CM | POA: Diagnosis not present

## 2023-01-03 DIAGNOSIS — N182 Chronic kidney disease, stage 2 (mild): Secondary | ICD-10-CM | POA: Diagnosis not present

## 2023-01-03 DIAGNOSIS — I129 Hypertensive chronic kidney disease with stage 1 through stage 4 chronic kidney disease, or unspecified chronic kidney disease: Secondary | ICD-10-CM | POA: Diagnosis not present

## 2023-01-03 DIAGNOSIS — E782 Mixed hyperlipidemia: Secondary | ICD-10-CM | POA: Diagnosis not present

## 2023-01-10 DIAGNOSIS — G609 Hereditary and idiopathic neuropathy, unspecified: Secondary | ICD-10-CM | POA: Diagnosis not present

## 2023-01-10 DIAGNOSIS — Z794 Long term (current) use of insulin: Secondary | ICD-10-CM | POA: Diagnosis not present

## 2023-01-10 DIAGNOSIS — E1122 Type 2 diabetes mellitus with diabetic chronic kidney disease: Secondary | ICD-10-CM | POA: Diagnosis not present

## 2023-01-10 DIAGNOSIS — E782 Mixed hyperlipidemia: Secondary | ICD-10-CM | POA: Diagnosis not present

## 2023-01-10 DIAGNOSIS — Z23 Encounter for immunization: Secondary | ICD-10-CM | POA: Diagnosis not present

## 2023-01-10 DIAGNOSIS — I129 Hypertensive chronic kidney disease with stage 1 through stage 4 chronic kidney disease, or unspecified chronic kidney disease: Secondary | ICD-10-CM | POA: Diagnosis not present

## 2023-01-10 DIAGNOSIS — N182 Chronic kidney disease, stage 2 (mild): Secondary | ICD-10-CM | POA: Diagnosis not present

## 2023-01-10 DIAGNOSIS — Z0000 Encounter for general adult medical examination without abnormal findings: Secondary | ICD-10-CM | POA: Diagnosis not present

## 2023-01-10 DIAGNOSIS — G720 Drug-induced myopathy: Secondary | ICD-10-CM | POA: Diagnosis not present

## 2023-04-25 DIAGNOSIS — M1711 Unilateral primary osteoarthritis, right knee: Secondary | ICD-10-CM | POA: Diagnosis not present

## 2023-04-25 DIAGNOSIS — M170 Bilateral primary osteoarthritis of knee: Secondary | ICD-10-CM | POA: Diagnosis not present

## 2023-05-01 DIAGNOSIS — M170 Bilateral primary osteoarthritis of knee: Secondary | ICD-10-CM | POA: Diagnosis not present

## 2023-05-09 DIAGNOSIS — M170 Bilateral primary osteoarthritis of knee: Secondary | ICD-10-CM | POA: Diagnosis not present

## 2023-08-05 DIAGNOSIS — E1122 Type 2 diabetes mellitus with diabetic chronic kidney disease: Secondary | ICD-10-CM | POA: Diagnosis not present

## 2023-08-05 DIAGNOSIS — E782 Mixed hyperlipidemia: Secondary | ICD-10-CM | POA: Diagnosis not present

## 2023-08-05 DIAGNOSIS — I129 Hypertensive chronic kidney disease with stage 1 through stage 4 chronic kidney disease, or unspecified chronic kidney disease: Secondary | ICD-10-CM | POA: Diagnosis not present

## 2023-08-05 DIAGNOSIS — N182 Chronic kidney disease, stage 2 (mild): Secondary | ICD-10-CM | POA: Diagnosis not present

## 2023-08-12 DIAGNOSIS — M545 Low back pain, unspecified: Secondary | ICD-10-CM | POA: Diagnosis not present

## 2023-08-12 DIAGNOSIS — M791 Myalgia, unspecified site: Secondary | ICD-10-CM | POA: Diagnosis not present

## 2023-09-15 ENCOUNTER — Inpatient Hospital Stay (HOSPITAL_COMMUNITY)
Admission: EM | Admit: 2023-09-15 | Discharge: 2023-09-18 | DRG: 690 | Disposition: A | Discharge status: Home Health | Attending: Internal Medicine | Admitting: Internal Medicine

## 2023-09-15 ENCOUNTER — Emergency Department (HOSPITAL_COMMUNITY)

## 2023-09-15 ENCOUNTER — Encounter (HOSPITAL_COMMUNITY): Payer: Self-pay | Admitting: Emergency Medicine

## 2023-09-15 DIAGNOSIS — N3000 Acute cystitis without hematuria: Principal | ICD-10-CM | POA: Diagnosis present

## 2023-09-15 DIAGNOSIS — Z7984 Long term (current) use of oral hypoglycemic drugs: Secondary | ICD-10-CM

## 2023-09-15 DIAGNOSIS — K589 Irritable bowel syndrome without diarrhea: Secondary | ICD-10-CM | POA: Diagnosis present

## 2023-09-15 DIAGNOSIS — R911 Solitary pulmonary nodule: Secondary | ICD-10-CM | POA: Diagnosis not present

## 2023-09-15 DIAGNOSIS — M1990 Unspecified osteoarthritis, unspecified site: Secondary | ICD-10-CM | POA: Diagnosis present

## 2023-09-15 DIAGNOSIS — R001 Bradycardia, unspecified: Secondary | ICD-10-CM | POA: Diagnosis not present

## 2023-09-15 DIAGNOSIS — I10 Essential (primary) hypertension: Secondary | ICD-10-CM | POA: Diagnosis present

## 2023-09-15 DIAGNOSIS — G8929 Other chronic pain: Secondary | ICD-10-CM | POA: Diagnosis present

## 2023-09-15 DIAGNOSIS — E119 Type 2 diabetes mellitus without complications: Secondary | ICD-10-CM | POA: Diagnosis present

## 2023-09-15 DIAGNOSIS — R531 Weakness: Secondary | ICD-10-CM | POA: Diagnosis not present

## 2023-09-15 DIAGNOSIS — E785 Hyperlipidemia, unspecified: Secondary | ICD-10-CM | POA: Diagnosis present

## 2023-09-15 DIAGNOSIS — K219 Gastro-esophageal reflux disease without esophagitis: Secondary | ICD-10-CM | POA: Diagnosis present

## 2023-09-15 DIAGNOSIS — E876 Hypokalemia: Secondary | ICD-10-CM | POA: Diagnosis present

## 2023-09-15 DIAGNOSIS — Z794 Long term (current) use of insulin: Secondary | ICD-10-CM

## 2023-09-15 DIAGNOSIS — Z791 Long term (current) use of non-steroidal anti-inflammatories (NSAID): Secondary | ICD-10-CM

## 2023-09-15 DIAGNOSIS — Z8249 Family history of ischemic heart disease and other diseases of the circulatory system: Secondary | ICD-10-CM

## 2023-09-15 DIAGNOSIS — Z8719 Personal history of other diseases of the digestive system: Secondary | ICD-10-CM

## 2023-09-15 DIAGNOSIS — R1084 Generalized abdominal pain: Secondary | ICD-10-CM | POA: Diagnosis not present

## 2023-09-15 DIAGNOSIS — E871 Hypo-osmolality and hyponatremia: Secondary | ICD-10-CM | POA: Diagnosis present

## 2023-09-15 DIAGNOSIS — N390 Urinary tract infection, site not specified: Secondary | ICD-10-CM

## 2023-09-15 DIAGNOSIS — Z880 Allergy status to penicillin: Secondary | ICD-10-CM

## 2023-09-15 DIAGNOSIS — J9811 Atelectasis: Secondary | ICD-10-CM | POA: Diagnosis present

## 2023-09-15 DIAGNOSIS — Z66 Do not resuscitate: Secondary | ICD-10-CM | POA: Diagnosis present

## 2023-09-15 DIAGNOSIS — R509 Fever, unspecified: Secondary | ICD-10-CM | POA: Diagnosis not present

## 2023-09-15 DIAGNOSIS — R109 Unspecified abdominal pain: Secondary | ICD-10-CM | POA: Diagnosis present

## 2023-09-15 DIAGNOSIS — Z90710 Acquired absence of both cervix and uterus: Secondary | ICD-10-CM

## 2023-09-15 DIAGNOSIS — R4781 Slurred speech: Secondary | ICD-10-CM | POA: Diagnosis present

## 2023-09-15 DIAGNOSIS — K760 Fatty (change of) liver, not elsewhere classified: Secondary | ICD-10-CM | POA: Diagnosis present

## 2023-09-15 DIAGNOSIS — Z7982 Long term (current) use of aspirin: Secondary | ICD-10-CM

## 2023-09-15 DIAGNOSIS — R9089 Other abnormal findings on diagnostic imaging of central nervous system: Secondary | ICD-10-CM | POA: Diagnosis not present

## 2023-09-15 DIAGNOSIS — Z833 Family history of diabetes mellitus: Secondary | ICD-10-CM

## 2023-09-15 DIAGNOSIS — G459 Transient cerebral ischemic attack, unspecified: Secondary | ICD-10-CM | POA: Diagnosis not present

## 2023-09-15 DIAGNOSIS — I6782 Cerebral ischemia: Secondary | ICD-10-CM | POA: Diagnosis not present

## 2023-09-15 DIAGNOSIS — Z79899 Other long term (current) drug therapy: Secondary | ICD-10-CM

## 2023-09-15 DIAGNOSIS — Z888 Allergy status to other drugs, medicaments and biological substances status: Secondary | ICD-10-CM

## 2023-09-15 DIAGNOSIS — I959 Hypotension, unspecified: Secondary | ICD-10-CM | POA: Diagnosis not present

## 2023-09-15 DIAGNOSIS — R000 Tachycardia, unspecified: Secondary | ICD-10-CM | POA: Diagnosis not present

## 2023-09-15 DIAGNOSIS — K449 Diaphragmatic hernia without obstruction or gangrene: Secondary | ICD-10-CM | POA: Diagnosis not present

## 2023-09-15 LAB — URINALYSIS, W/ REFLEX TO CULTURE (INFECTION SUSPECTED)
Bilirubin Urine: NEGATIVE
Glucose, UA: NEGATIVE mg/dL
Ketones, ur: NEGATIVE mg/dL
Nitrite: NEGATIVE
Protein, ur: 30 mg/dL — AB
Specific Gravity, Urine: 1.014 (ref 1.005–1.030)
WBC, UA: >50 WBC/hpf (ref 0–5)
pH: 6 (ref 5.0–8.0)

## 2023-09-15 LAB — COMPREHENSIVE METABOLIC PANEL WITH GFR
ALT: 24 U/L (ref 0–44)
AST: 36 U/L (ref 15–41)
Albumin: 2.9 g/dL — ABNORMAL LOW (ref 3.5–5.0)
Alkaline Phosphatase: 43 U/L (ref 38–126)
Anion gap: 13 (ref 5–15)
BUN: 11 mg/dL (ref 8–23)
CO2: 21 mmol/L — ABNORMAL LOW (ref 22–32)
Calcium: 8.8 mg/dL — ABNORMAL LOW (ref 8.9–10.3)
Chloride: 94 mmol/L — ABNORMAL LOW (ref 98–111)
Creatinine, Ser: 0.96 mg/dL (ref 0.44–1.00)
GFR, Estimated: 57 mL/min — ABNORMAL LOW (ref >60)
Glucose, Bld: 181 mg/dL — ABNORMAL HIGH (ref 70–99)
Potassium: 3.1 mmol/L — ABNORMAL LOW (ref 3.5–5.1)
Sodium: 128 mmol/L — ABNORMAL LOW (ref 135–145)
Total Bilirubin: 0.9 mg/dL (ref 0.0–1.2)
Total Protein: 6.3 g/dL — ABNORMAL LOW (ref 6.5–8.1)

## 2023-09-15 LAB — LIPASE, BLOOD: Lipase: 29 U/L (ref 11–51)

## 2023-09-15 LAB — CBC WITH DIFFERENTIAL/PLATELET
Abs Immature Granulocytes: 0.05 10*3/uL (ref 0.00–0.07)
Basophils Absolute: 0 10*3/uL (ref 0.0–0.1)
Basophils Relative: 0 %
Eosinophils Absolute: 0 10*3/uL (ref 0.0–0.5)
Eosinophils Relative: 0 %
HCT: 37 % (ref 36.0–46.0)
Hemoglobin: 13 g/dL (ref 12.0–15.0)
Immature Granulocytes: 0 %
Lymphocytes Relative: 3 %
Lymphs Abs: 0.4 10*3/uL — ABNORMAL LOW (ref 0.7–4.0)
MCH: 30.6 pg (ref 26.0–34.0)
MCHC: 35.1 g/dL (ref 30.0–36.0)
MCV: 87.1 fL (ref 80.0–100.0)
Monocytes Absolute: 0.5 10*3/uL (ref 0.1–1.0)
Monocytes Relative: 4 %
Neutro Abs: 10.9 10*3/uL — ABNORMAL HIGH (ref 1.7–7.7)
Neutrophils Relative %: 93 %
Platelets: 187 10*3/uL (ref 150–400)
RBC: 4.25 MIL/uL (ref 3.87–5.11)
RDW: 13 % (ref 11.5–15.5)
WBC: 11.8 10*3/uL — ABNORMAL HIGH (ref 4.0–10.5)
nRBC: 0 % (ref 0.0–0.2)

## 2023-09-15 MED ORDER — SODIUM CHLORIDE 0.9 % IV BOLUS
500.0000 mL | Freq: Once | INTRAVENOUS | Status: AC
  Administered 2023-09-15: 500 mL via INTRAVENOUS

## 2023-09-15 MED ORDER — SODIUM CHLORIDE 0.9 % IV SOLN
1.0000 g | Freq: Once | INTRAVENOUS | Status: AC
  Administered 2023-09-15: 1 g via INTRAVENOUS
  Filled 2023-09-15: qty 10

## 2023-09-15 MED ORDER — IOHEXOL 350 MG/ML SOLN
75.0000 mL | Freq: Once | INTRAVENOUS | Status: AC | PRN
  Administered 2023-09-15: 75 mL via INTRAVENOUS

## 2023-09-15 MED ORDER — POTASSIUM CHLORIDE 10 MEQ/100ML IV SOLN
10.0000 meq | Freq: Once | INTRAVENOUS | Status: AC
  Administered 2023-09-15: 10 meq via INTRAVENOUS
  Filled 2023-09-15: qty 100

## 2023-09-15 MED ORDER — SODIUM CHLORIDE 0.9 % IV SOLN
Freq: Once | INTRAVENOUS | Status: AC
  Administered 2023-09-15: 125 mL/h via INTRAVENOUS

## 2023-09-15 NOTE — ED Triage Notes (Signed)
 Patient BIB EMS from home. Patient c/o abdominal pain x1 day, chronic belly pain, but worse today. Patient endorses frequent & pain while urinating.   Son claims patient has had slurred speech for the last three days, EMS does not endorse, patient does not have slurred speech on arrival.

## 2023-09-15 NOTE — ED Provider Notes (Signed)
   EMERGENCY DEPARTMENT AT [facility] Provider Note   CSN: [id number] Arrival date & time: 09/15/23  1901     History {Add pertinent medical, surgical, social history, OB history to HPI:1} Chief Complaint  Patient presents with   Abdominal Pain    Dominique Simmons is a 89 y.o. female.   Abdominal Pain      Home Medications Prior to Admission medications   Medication Sig Start Date End Date Taking? Authorizing Provider  acetaminophen (TYLENOL) 325 MG tablet Take 650 mg by mouth every 6 (six) hours as needed.    [provider]  amLODipine (NORVASC) 5 MG tablet Take 5 mg by mouth daily.    [provider]  aspirin 81 MG tablet Take 81 mg by mouth daily.    [provider]  clotrimazole-betamethasone (LOTRISONE) cream  01/31/12   [provider]  hydrochlorothiazide (HYDRODIURIL) 25 MG tablet Take 25 mg by mouth daily.    [provider]  [place] Lanius 500 MG CAPS Take by mouth.    [provider]  losartan (COZAAR) 25 MG tablet Take 25 mg by mouth daily.    [provider]  pantoprazole (PROTONIX) 20 MG tablet TAKE 1 TABLET BY MOUTH EVERY DAY 10/02/21   Napoleon Form, MD      Allergies    Amoxicillin, Lescol [fluvastatin sodium], Lipitor [atorvastatin], Lisinopril, and Pravachol [pravastatin sodium]    Review of Systems   Review of Systems  Gastrointestinal:  Positive for abdominal pain.    Physical Exam Updated Vital Signs BP 133/75 (BP Location: Right Arm)   Pulse 97   Temp 98.1 F (36.7 C) (Oral)   Resp 20   Ht 5\' 6"  (1.676 m)   Wt 65.8 kg   SpO2 97%   BMI 23.40 kg/m  Physical Exam  ED Results / Procedures / Treatments   Labs (all labs ordered are listed, but only abnormal results are displayed) Labs Reviewed - No data to display  EKG None  Radiology No results found.  Procedures Procedures  {Document cardiac monitor, telemetry assessment procedure when  appropriate:1}  Medications Ordered in ED Medications - No data to display  ED Course/ Medical Decision Making/ A&P   {   Click here for ABCD2, HEART and other calculatorsREFRESH Note before signing :1}                              Medical Decision Making  ***  {Document critical care time when appropriate:1} {Document review of labs and clinical decision tools ie heart score, Chads2Vasc2 etc:1}  {Document your independent review of radiology images, and any outside records:1} {Document your discussion with family members, caretakers, and with consultants:1} {Document social determinants of health affecting pt's care:1} {Document your decision making why or why not admission, treatments were needed:1} Final Clinical Impression(s) / ED Diagnoses Final diagnoses:  None    Rx / DC Orders ED Discharge Orders     None

## 2023-09-15 NOTE — ED Notes (Signed)
 Patient transported to CT

## 2023-09-16 ENCOUNTER — Other Ambulatory Visit: Payer: Self-pay

## 2023-09-16 ENCOUNTER — Encounter (HOSPITAL_COMMUNITY): Payer: Self-pay | Admitting: Internal Medicine

## 2023-09-16 ENCOUNTER — Observation Stay (HOSPITAL_COMMUNITY)

## 2023-09-16 DIAGNOSIS — E876 Hypokalemia: Secondary | ICD-10-CM | POA: Diagnosis not present

## 2023-09-16 DIAGNOSIS — Z8719 Personal history of other diseases of the digestive system: Secondary | ICD-10-CM | POA: Diagnosis not present

## 2023-09-16 DIAGNOSIS — Z90710 Acquired absence of both cervix and uterus: Secondary | ICD-10-CM | POA: Diagnosis not present

## 2023-09-16 DIAGNOSIS — K589 Irritable bowel syndrome without diarrhea: Secondary | ICD-10-CM | POA: Diagnosis present

## 2023-09-16 DIAGNOSIS — I10 Essential (primary) hypertension: Secondary | ICD-10-CM | POA: Diagnosis present

## 2023-09-16 DIAGNOSIS — E871 Hypo-osmolality and hyponatremia: Principal | ICD-10-CM

## 2023-09-16 DIAGNOSIS — R509 Fever, unspecified: Secondary | ICD-10-CM | POA: Diagnosis not present

## 2023-09-16 DIAGNOSIS — Z66 Do not resuscitate: Secondary | ICD-10-CM | POA: Diagnosis present

## 2023-09-16 DIAGNOSIS — E119 Type 2 diabetes mellitus without complications: Secondary | ICD-10-CM | POA: Diagnosis present

## 2023-09-16 DIAGNOSIS — R4781 Slurred speech: Secondary | ICD-10-CM

## 2023-09-16 DIAGNOSIS — G459 Transient cerebral ischemic attack, unspecified: Secondary | ICD-10-CM | POA: Diagnosis not present

## 2023-09-16 DIAGNOSIS — E785 Hyperlipidemia, unspecified: Secondary | ICD-10-CM | POA: Diagnosis present

## 2023-09-16 DIAGNOSIS — J9811 Atelectasis: Secondary | ICD-10-CM | POA: Diagnosis present

## 2023-09-16 DIAGNOSIS — N3000 Acute cystitis without hematuria: Secondary | ICD-10-CM | POA: Diagnosis not present

## 2023-09-16 DIAGNOSIS — Z833 Family history of diabetes mellitus: Secondary | ICD-10-CM | POA: Diagnosis not present

## 2023-09-16 DIAGNOSIS — K219 Gastro-esophageal reflux disease without esophagitis: Secondary | ICD-10-CM | POA: Diagnosis present

## 2023-09-16 DIAGNOSIS — Z8249 Family history of ischemic heart disease and other diseases of the circulatory system: Secondary | ICD-10-CM | POA: Diagnosis not present

## 2023-09-16 DIAGNOSIS — R9089 Other abnormal findings on diagnostic imaging of central nervous system: Secondary | ICD-10-CM | POA: Diagnosis not present

## 2023-09-16 DIAGNOSIS — R109 Unspecified abdominal pain: Secondary | ICD-10-CM | POA: Diagnosis present

## 2023-09-16 DIAGNOSIS — G8929 Other chronic pain: Secondary | ICD-10-CM | POA: Diagnosis present

## 2023-09-16 DIAGNOSIS — Z888 Allergy status to other drugs, medicaments and biological substances status: Secondary | ICD-10-CM | POA: Diagnosis not present

## 2023-09-16 DIAGNOSIS — R531 Weakness: Secondary | ICD-10-CM

## 2023-09-16 DIAGNOSIS — Z880 Allergy status to penicillin: Secondary | ICD-10-CM | POA: Diagnosis not present

## 2023-09-16 DIAGNOSIS — K760 Fatty (change of) liver, not elsewhere classified: Secondary | ICD-10-CM | POA: Diagnosis present

## 2023-09-16 DIAGNOSIS — Z794 Long term (current) use of insulin: Secondary | ICD-10-CM | POA: Diagnosis not present

## 2023-09-16 DIAGNOSIS — M1990 Unspecified osteoarthritis, unspecified site: Secondary | ICD-10-CM | POA: Diagnosis present

## 2023-09-16 DIAGNOSIS — I6782 Cerebral ischemia: Secondary | ICD-10-CM | POA: Diagnosis not present

## 2023-09-16 DIAGNOSIS — Z7984 Long term (current) use of oral hypoglycemic drugs: Secondary | ICD-10-CM | POA: Diagnosis not present

## 2023-09-16 LAB — BASIC METABOLIC PANEL WITH GFR
Anion gap: 8 (ref 5–15)
BUN: 18 mg/dL (ref 8–23)
CO2: 24 mmol/L (ref 22–32)
Calcium: 8.4 mg/dL — ABNORMAL LOW (ref 8.9–10.3)
Chloride: 99 mmol/L (ref 98–111)
Creatinine, Ser: 0.93 mg/dL (ref 0.44–1.00)
GFR, Estimated: 59 mL/min — ABNORMAL LOW (ref >60)
Glucose, Bld: 117 mg/dL — ABNORMAL HIGH (ref 70–99)
Potassium: 3.4 mmol/L — ABNORMAL LOW (ref 3.5–5.1)
Sodium: 131 mmol/L — ABNORMAL LOW (ref 135–145)

## 2023-09-16 LAB — CBC
HCT: 35.6 % — ABNORMAL LOW (ref 36.0–46.0)
Hemoglobin: 12.1 g/dL (ref 12.0–15.0)
MCH: 30.3 pg (ref 26.0–34.0)
MCHC: 34 g/dL (ref 30.0–36.0)
MCV: 89 fL (ref 80.0–100.0)
Platelets: 152 K/uL (ref 150–400)
RBC: 4 MIL/uL (ref 3.87–5.11)
RDW: 13.1 % (ref 11.5–15.5)
WBC: 6.3 K/uL (ref 4.0–10.5)
nRBC: 0 % (ref 0.0–0.2)

## 2023-09-16 LAB — HEMOGLOBIN A1C
Hgb A1c MFr Bld: 6.7 % — ABNORMAL HIGH (ref 4.8–5.6)
Mean Plasma Glucose: 145.59 mg/dL

## 2023-09-16 LAB — OSMOLALITY: Osmolality: 275 mosm/kg (ref 275–295)

## 2023-09-16 LAB — URINE CULTURE: Culture: 40000 — AB

## 2023-09-16 LAB — GLUCOSE, CAPILLARY
Glucose-Capillary: 102 mg/dL — ABNORMAL HIGH (ref 70–99)
Glucose-Capillary: 199 mg/dL — ABNORMAL HIGH (ref 70–99)
Glucose-Capillary: 111 mg/dL — ABNORMAL HIGH (ref 70–99)
Glucose-Capillary: 180 mg/dL — ABNORMAL HIGH (ref 70–99)

## 2023-09-16 LAB — MAGNESIUM: Magnesium: 1.4 mg/dL — ABNORMAL LOW (ref 1.7–2.4)

## 2023-09-16 MED ORDER — SODIUM CHLORIDE 0.9 % IV SOLN: INTRAVENOUS | Status: AC

## 2023-09-16 MED ORDER — SODIUM CHLORIDE 0.9 % IV SOLN: INTRAVENOUS | Status: DC

## 2023-09-16 MED ORDER — SODIUM CHLORIDE 0.9 % IV SOLN
1.0000 g | INTRAVENOUS | Status: DC
  Administered 2023-09-16 – 2023-09-17 (×2): 1 g via INTRAVENOUS
  Filled 2023-09-16 (×2): qty 10

## 2023-09-16 MED ORDER — POTASSIUM CHLORIDE CRYS ER 20 MEQ PO TBCR
40.0000 meq | EXTENDED_RELEASE_TABLET | Freq: Once | ORAL | Status: AC
  Administered 2023-09-16: 40 meq via ORAL
  Filled 2023-09-16: qty 2

## 2023-09-16 MED ORDER — INSULIN ASPART 100 UNIT/ML IJ SOLN
0.0000 [IU] | Freq: Three times a day (TID) | INTRAMUSCULAR | Status: DC
  Administered 2023-09-16: 3 [IU] via SUBCUTANEOUS
  Administered 2023-09-17: 2 [IU] via SUBCUTANEOUS
  Administered 2023-09-18: 3 [IU] via SUBCUTANEOUS

## 2023-09-16 MED ORDER — ENOXAPARIN SODIUM 40 MG/0.4ML IJ SOSY
40.0000 mg | PREFILLED_SYRINGE | INTRAMUSCULAR | Status: DC
  Administered 2023-09-17: 40 mg via SUBCUTANEOUS
  Filled 2023-09-16: qty 0.4

## 2023-09-16 MED ORDER — INSULIN GLARGINE-YFGN 100 UNIT/ML ~~LOC~~ SOLN
12.0000 [IU] | Freq: Every day | SUBCUTANEOUS | Status: DC
  Administered 2023-09-16 – 2023-09-18 (×3): 12 [IU] via SUBCUTANEOUS
  Filled 2023-09-16 (×3): qty 0.12

## 2023-09-16 MED ORDER — ORAL CARE MOUTH RINSE: 15.0000 mL | OROMUCOSAL | Status: DC | PRN

## 2023-09-16 MED ORDER — ACETAMINOPHEN 325 MG PO TABS: 650.0000 mg | ORAL_TABLET | Freq: Four times a day (QID) | ORAL | Status: DC | PRN

## 2023-09-16 MED ORDER — ACETAMINOPHEN 500 MG PO TABS
1000.0000 mg | ORAL_TABLET | Freq: Three times a day (TID) | ORAL | Status: DC | PRN
  Administered 2023-09-16 – 2023-09-18 (×4): 1000 mg via ORAL
  Filled 2023-09-16 (×4): qty 2

## 2023-09-16 MED ORDER — MAGNESIUM SULFATE 4 GM/100ML IV SOLN
4.0000 g | Freq: Once | INTRAVENOUS | Status: AC
  Administered 2023-09-16: 4 g via INTRAVENOUS
  Filled 2023-09-16: qty 100

## 2023-09-16 MED ORDER — INSULIN ASPART 100 UNIT/ML IJ SOLN: 0.0000 [IU] | Freq: Every day | INTRAMUSCULAR | Status: DC

## 2023-09-16 NOTE — Progress Notes (Signed)
 PROGRESS NOTE  Dominique Simmons UJW:[id number] DOB: February 10, 1934 DOA: 09/15/2023 PCP: Georgianne Fick, MD  HPI/Recap of past 24 hours: Dominique Simmons is a 89 y.o. female with medical history significant of DM, HTN, HLD, IBS, GERD, hiatal hernia, Candida esophagitis, esophageal stricture status post dilation presented to the ED with complaints of suprapubic abdominal pain, dysuria and concern for slurred speech.  Per triage note, family reported slurred speech x 3 days but this was not noticed by EMS or ED provider.  Afebrile.  Labs notable for WBC count 11.8, sodium 128, potassium 3.1. UA with negative nitrite, large amount of leukocytes, and microscopy showing >50 WBCs and few bacteria. Chest x-ray showing patchy left basilar reticular nodularity favored to reflect atelectasis. CT abdomen pelvis showing findings compatible with cystitis and hepatic steatosis.  Patient admitted for further management.    Today, patient denies any new complaints, just reports persistent suprapubic tenderness.  No slurred speech noted.  Able to move all extremities equally.  Patient denies any chest pain, nausea/vomiting, fever/chills.    Assessment/Plan: Principal Problem:   Acute cystitis Active Problems:   Hyponatremia   Hypokalemia   Slurred speech   Generalized weakness   Essential hypertension   Insulin dependent type 2 diabetes mellitus (HCC)   Acute cystitis Noted complaints of suprapubic pain and dysuria Currently afebrile, with no leukocytosis UA with evidence of pyuria and bacteriuria, urine culture pending  CT showing findings compatible with cystitis Continue ceftriaxone   Hyponatremia Likely related to poor p.o. intake Continue gentle IV fluid hydration Daily BMP   Hypokalemia/hypomagnesemia Replace as needed   ?Slurred speech Patient's speech is fluent and she has no focal neurodeficit on exam. Patient thinks her slurred speech at home might have been due to her mouth being  too dry Brain MRI showed no acute findings, negative for infarct   Generalized weakness PT/OT eval, fall precautions   Insulin-dependent type 2 diabetes Last A1c 6.7 on 09/16/2023 SSI, Semglee, Accu-Cheks, hypoglycemic protocol   Hypertension Currently normotensive, BP stable Hold home HCTZ, losartan, amlodipine, may consider discontinuing hydrochlorothiazide upon discharge and monitoring BP closely    Estimated body mass index is 23.05 kg/m as calculated from the following:   Height as of this encounter: 5' 6.5" (1.689 m).   Weight as of this encounter: 65.8 kg.     Code Status: DNR  Family Communication: None at bedside  Disposition Plan: Status is: Observation The patient remains OBS appropriate and will d/c before 2 midnights.      Consultants: None  Procedures: None  Antimicrobials: Ceftriaxone  DVT prophylaxis: Lovenox   Objective: Vitals:   09/15/23 2145 09/15/23 2218 09/16/23 0501 09/16/23 1001  BP: 123/63 (!) 126/53 111/61 104/68  Pulse: 91 76 78 75  Resp: 20 18 18 19   Temp:  98.2 F (36.8 C) 98.1 F (36.7 C) 98 F (36.7 C)  TempSrc:  Oral  Oral  SpO2: 100% 98% 100% 98%  Weight:      Height:  5' 6.5" (1.689 m)      Intake/Output Summary (Last 24 hours) at 09/16/2023 1301 Last data filed at 09/16/2023 0600 Gross per 24 hour  Intake 2070.89 ml  Output 750 ml  Net 1320.89 ml   Filed Weights   09/15/23 1911  Weight: 65.8 kg    Exam: General: NAD, appears dry Cardiovascular: S1, S2 present Respiratory: CTAB Abdomen: Soft, nontender, nondistended, bowel sounds present Musculoskeletal: No bilateral pedal edema noted Skin: Normal Psychiatry: Normal mood  Data Reviewed: CBC: Recent Labs  Lab 09/15/23 1929 09/16/23 0919  WBC 11.8* 6.3  NEUTROABS 10.9*  --   HGB 13.0 12.1  HCT 37.0 35.6*  MCV 87.1 89.0  PLT 187 152   Basic Metabolic Panel: Recent Labs  Lab 09/15/23 1929 09/16/23 0919  NA 128* 131*  K 3.1* 3.4*  CL  94* 99  CO2 21* 24  GLUCOSE 181* 117*  BUN 11 18  CREATININE 0.96 0.93  CALCIUM 8.8* 8.4*  MG  --  1.4*   GFR: Estimated Creatinine Clearance: 39.9 mL/min (by C-G formula based on SCr of 0.93 mg/dL). Liver Function Tests: Recent Labs  Lab 09/15/23 1929  AST 36  ALT 24  ALKPHOS 43  BILITOT 0.9  PROT 6.3*  ALBUMIN 2.9*   Recent Labs  Lab 09/15/23 1929  LIPASE 29   No results for input(s): "AMMONIA" in the last 168 hours. Coagulation Profile: No results for input(s): "INR", "PROTIME" in the last 168 hours. Cardiac Enzymes: No results for input(s): "CKTOTAL", "CKMB", "CKMBINDEX", "TROPONINI" in the last 168 hours. BNP (last 3 results) No results for input(s): "PROBNP" in the last 8760 hours. HbA1C: Recent Labs    09/16/23 0919  HGBA1C 6.7*   CBG: Recent Labs  Lab 09/16/23 0720 09/16/23 1147  GLUCAP 102* 199*   Lipid Profile: No results for input(s): "CHOL", "HDL", "LDLCALC", "TRIG", "CHOLHDL", "LDLDIRECT" in the last 72 hours. Thyroid Function Tests: No results for input(s): "TSH", "T4TOTAL", "FREET4", "T3FREE", "THYROIDAB" in the last 72 hours. Anemia Panel: No results for input(s): "VITAMINB12", "FOLATE", "FERRITIN", "TIBC", "IRON", "RETICCTPCT" in the last 72 hours. Urine analysis:    Component Value Date/Time   COLORURINE YELLOW 09/15/2023 1923   APPEARANCEUR CLOUDY (A) 09/15/2023 1923   LABSPEC 1.014 09/15/2023 1923   PHURINE 6.0 09/15/2023 1923   GLUCOSEU NEGATIVE 09/15/2023 1923   HGBUR SMALL (A) 09/15/2023 1923   BILIRUBINUR NEGATIVE 09/15/2023 1923   KETONESUR NEGATIVE 09/15/2023 1923   PROTEINUR 30 (A) 09/15/2023 1923   UROBILINOGEN 0.2 11/27/2006 0815   NITRITE NEGATIVE 09/15/2023 1923   LEUKOCYTESUR LARGE (A) 09/15/2023 1923   Sepsis Labs: @LABRCNTIP (procalcitonin:4,lacticidven:4)  )No results found for this or any previous visit (from the past 240 hours).    Studies: MR BRAIN WO CONTRAST Result Date: 09/16/2023 CLINICAL DATA:  TIA  EXAM: MRI HEAD WITHOUT CONTRAST TECHNIQUE: Multiplanar, multiecho pulse sequences of the brain and surrounding structures were obtained without intravenous contrast. COMPARISON:  Head CT 08/23/2011 FINDINGS: Brain: CSF signal widening at the lower left CP angle cistern measuring 2.3 cm in length, stable from 2013. Mild for age cerebral volume loss and chronic small vessel ischemia. No acute or subacute infarct, hemorrhage, hydrocephalus, or brain mass. Vascular: Normal flow voids. Skull and upper cervical spine: Normal marrow signal. Sinuses/Orbits: Negative. IMPRESSION: No acute finding.  Negative for infarct. Electronically Signed   By: Tiburcio Pea M.D.   On: 09/16/2023 08:10   CT ABDOMEN PELVIS W CONTRAST Result Date: 09/15/2023 CLINICAL DATA:  Acute nonlocalized abdominal pain. Chronic belly pain but worsening today. Pain with urinating. EXAM: CT ABDOMEN AND PELVIS WITH CONTRAST TECHNIQUE: Multidetector CT imaging of the abdomen and pelvis was performed using the standard protocol following bolus administration of intravenous contrast. RADIATION DOSE REDUCTION: This exam was performed according to the departmental dose-optimization program which includes automated exposure control, adjustment of the mA and/or kV according to patient size and/or use of iterative reconstruction technique. CONTRAST:  75mL OMNIPAQUE IOHEXOL 350 MG/ML SOLN COMPARISON:  02/08/2020 FINDINGS:  Lower chest: No acute abnormality. Hepatobiliary: Hepatic steatosis. Normal gallbladder. No biliary dilation. Pancreas: Unremarkable. Spleen: Unremarkable. Adrenals/Urinary Tract: Normal adrenal glands. No urinary calculi or hydronephrosis. Marked bladder wall thickening. Mucosal hyperenhancement throughout the bladder. Trace adjacent perivesical stranding. Stomach/Bowel: Normal caliber large and small bowel. No bowel wall thickening. The appendix is normal. Small hiatal hernia. Vascular/Lymphatic: No significant vascular findings are  present. No enlarged abdominal or pelvic lymph nodes. Reproductive: Hysterectomy. Other: No free intraperitoneal fluid or air. Musculoskeletal: No acute fracture. IMPRESSION: 1. Findings compatible with cystitis. 2. Hepatic steatosis. Electronically Signed   By: Minerva Fester M.D.   On: 09/15/2023 21:00   DG Chest Port 1 View Result Date: 09/15/2023 CLINICAL DATA:  weakness EXAM: PORTABLE CHEST 1 VIEW COMPARISON:  December 27, 2006 FINDINGS: The cardiomediastinal silhouette is unchanged in contour.Similar appearance of biapical scarring when accounting for differences in technique. Atherosclerotic calcifications. No pleural effusion. No pneumothorax. Patchy LEFT basilar reticular nodularity IMPRESSION: Patchy LEFT basilar reticular nodularity, favored to reflect atelectasis. Electronically Signed   By: Meda Klinefelter M.D.   On: 09/15/2023 19:50    Scheduled Meds:  insulin aspart  0-15 Units Subcutaneous TID WC   insulin aspart  0-5 Units Subcutaneous QHS   insulin glargine-yfgn  12 Units Subcutaneous Daily    Continuous Infusions:  sodium chloride 75 mL/hr at 09/16/23 0548   cefTRIAXone (ROCEPHIN)  IV       LOS: 0 days     Briant Cedar, MD [facility]  If 7PM-7AM, please contact night-coverage [email or web address] 09/16/2023, 1:01 PM

## 2023-09-16 NOTE — Plan of Care (Signed)

## 2023-09-16 NOTE — H&P (Signed)
 History and Physical    Dominique Simmons ZOX:[id number] DOB: [birth year] DOA: 09/15/2023  PCP: Georgianne Fick, MD  Patient coming from: Home  Chief Complaint: Abdominal pain  HPI: Dominique Simmons is a 89 y.o. female with medical history significant of insulin-dependent type 2 diabetes, hypertension, hyperlipidemia, IBS, GERD, hiatal hernia, Candida esophagitis, esophageal stricture status post dilation presented to the ED with complaints of suprapubic abdominal pain and dysuria.  Per triage note, family reported slurred speech x 3 days but this was not noticed by EMS or ED provider.  Afebrile.  Labs notable for WBC count 11.8, sodium 128, potassium 3.1, chloride 94, bicarb 21, glucose 181, creatinine 0.9, normal lipase and LFTs.  UA with negative nitrite, large amount of leukocytes, and microscopy showing >50 WBCs and few bacteria.  Urine culture pending.  Chest x-ray showing patchy left basilar reticular nodularity favored to reflect atelectasis.  CT abdomen pelvis showing findings compatible with cystitis and hepatic steatosis. Patient was given ceftriaxone, IV potassium, and 500 mL IV fluids in the ED.  Patient is reporting dysuria, suprapubic pain, and urinary frequency/urgency.  Unclear when her symptoms started.  She reports history of chronic abdominal pain.  Denies fevers, nausea, or vomiting.  States yesterday when she came into the emergency room her speech was slurred and she thinks this might be due to her mouth being too dry.  She is reporting poor p.o. intake for the past few weeks.  Also reporting generalized weakness.  Denies cough, shortness breath, or chest pain.  Review of Systems:  Review of Systems  All other systems reviewed and are negative.   Past Medical History:  Diagnosis Date   Arthritis    Cataract    BEGINING   Diabetes mellitus without complication (HCC)    pt d/c Metformin August 2021 due to esophageal ulcers   Esophageal ring 11/2011   GERD  (gastroesophageal reflux disease)    Hyperlipidemia    Hypertension    IBS (irritable bowel syndrome)    Sliding hiatal hernia 11/2011    Past Surgical History:  Procedure Laterality Date   COLONOSCOPY     KNEE ARTHROSCOPY  11/2006   right   ROTATOR CUFF REPAIR     left   TONSILLECTOMY     TOTAL ABDOMINAL HYSTERECTOMY  1980   UPPER GASTROINTESTINAL ENDOSCOPY     VEIN LIGATION AND STRIPPING  2003   bilateral   WRIST SURGERY     left     reports that she has never smoked. She has never used smokeless tobacco. She reports that she does not drink alcohol and does not use drugs.  Allergies  Allergen Reactions   Amoxicillin     REACTION: rash   Lescol [Fluvastatin Sodium]     Myalgia   Lipitor [Atorvastatin]     Myalgia   Lisinopril     Cough   Pravachol [Pravastatin Sodium]     Myalgia    Family History  Problem Relation Age of Onset   Coronary artery disease Mother    Heart disease Mother    Coronary artery disease Father    Heart disease Father    Coronary artery disease Sister    Heart attack Sister    Diabetes Brother    Colon cancer Neg Hx    Esophageal cancer Neg Hx    Rectal cancer Neg Hx    Stomach cancer Neg Hx     Prior to Admission medications   Medication Sig Start Date End Date  Taking? Authorizing Provider  acetaminophen (TYLENOL) 325 MG tablet Take 650 mg by mouth every 6 (six) hours as needed for mild pain (pain score 1-3).   Yes [provider]  amLODipine (NORVASC) 5 MG tablet Take 5 mg by mouth daily.   Yes [provider]  aspirin 81 MG tablet Take 81 mg by mouth daily.   Yes [provider]  clotrimazole-betamethasone (LOTRISONE) cream Apply 1 Application topically daily as needed (for rash). 01/31/12  Yes [provider]  COD LIVER OIL PO Take 1 tablet by mouth daily at 6 (six) AM.   Yes [provider]  hydrochlorothiazide (HYDRODIURIL) 25 MG tablet Take 25 mg by mouth daily.   Yes [provider]  losartan (COZAAR) 25 MG tablet Take 25 mg by mouth daily.   Yes [provider]  meloxicam (MOBIC) 15 MG tablet Take 15 mg by mouth daily. 09/03/23  Yes [provider]  metFORMIN (GLUCOPHAGE) 500 MG tablet Take 2 tablets by mouth every evening.   Yes [provider]  TOUJEO SOLOSTAR 300 UNIT/ML Solostar Pen Inject 12 Units into the skin daily.   Yes [provider]    Physical Exam: Vitals:   09/15/23 2100 09/15/23 2130 09/15/23 2145 09/15/23 2218  BP: 121/68 127/70 123/63 (!) 126/53  Pulse: 86 90 91 76  Resp: 11 19 20 18   Temp:    98.2 F (36.8 C)  TempSrc:    Oral  SpO2: 99% 97% 100% 98%  Weight:      Height:    5' 6.5" (1.689 m)    Physical Exam Vitals reviewed.  Constitutional:      General: She is not in acute distress. HENT:     Head: Normocephalic and atraumatic.  Eyes:     Extraocular Movements: Extraocular movements intact.  Cardiovascular:     Rate and Rhythm: Normal rate and regular rhythm.     Pulses: Normal pulses.  Pulmonary:     Effort: Pulmonary effort is normal. No respiratory distress.     Breath sounds: Normal breath sounds. No wheezing or rales.  Abdominal:     General: Bowel sounds are normal. There is no distension.     Palpations: Abdomen is soft.     Tenderness: There is no abdominal tenderness. There is no guarding.  Musculoskeletal:     Cervical back: Normal range of motion.     Right lower leg: No edema.     Left lower leg: No edema.  Skin:    General: Skin is warm and dry.  Neurological:     General: No focal deficit present.     Mental Status: She is alert and oriented to person, place, and time.     Cranial Nerves: No cranial nerve deficit.     Sensory: No sensory deficit.     Motor: No weakness.     Labs on Admission: I have personally reviewed following labs and imaging studies  CBC: Recent Labs  Lab 09/15/23 1929  WBC 11.8*  NEUTROABS 10.9*  HGB 13.0  HCT 37.0  MCV 87.1   PLT 187   Basic Metabolic Panel: Recent Labs  Lab 09/15/23 1929  NA 128*  K 3.1*  CL 94*  CO2 21*  GLUCOSE 181*  BUN 11  CREATININE 0.96  CALCIUM 8.8*   GFR: Estimated Creatinine Clearance: 38.7 mL/min (by C-G formula based on SCr of 0.96 mg/dL). Liver Function Tests: Recent Labs  Lab 09/15/23 1929  AST 36  ALT  24  ALKPHOS 43  BILITOT 0.9  PROT 6.3*  ALBUMIN 2.9*   Recent Labs  Lab 09/15/23 1929  LIPASE 29   No results for input(s): "AMMONIA" in the last 168 hours. Coagulation Profile: No results for input(s): "INR", "PROTIME" in the last 168 hours. Cardiac Enzymes: No results for input(s): "CKTOTAL", "CKMB", "CKMBINDEX", "TROPONINI" in the last 168 hours. BNP (last 3 results) No results for input(s): "PROBNP" in the last 8760 hours. HbA1C: No results for input(s): "HGBA1C" in the last 72 hours. CBG: No results for input(s): "GLUCAP" in the last 168 hours. Lipid Profile: No results for input(s): "CHOL", "HDL", "LDLCALC", "TRIG", "CHOLHDL", "LDLDIRECT" in the last 72 hours. Thyroid Function Tests: No results for input(s): "TSH", "T4TOTAL", "FREET4", "T3FREE", "THYROIDAB" in the last 72 hours. Anemia Panel: No results for input(s): "VITAMINB12", "FOLATE", "FERRITIN", "TIBC", "IRON", "RETICCTPCT" in the last 72 hours. Urine analysis:    Component Value Date/Time   COLORURINE YELLOW 09/15/2023 1923   APPEARANCEUR CLOUDY (A) 09/15/2023 1923   LABSPEC 1.014 09/15/2023 1923   PHURINE 6.0 09/15/2023 1923   GLUCOSEU NEGATIVE 09/15/2023 1923   HGBUR SMALL (A) 09/15/2023 1923   BILIRUBINUR NEGATIVE 09/15/2023 1923   KETONESUR NEGATIVE 09/15/2023 1923   PROTEINUR 30 (A) 09/15/2023 1923   UROBILINOGEN 0.2 11/27/2006 0815   NITRITE NEGATIVE 09/15/2023 1923   LEUKOCYTESUR LARGE (A) 09/15/2023 1923    Radiological Exams on Admission: CT ABDOMEN PELVIS W CONTRAST Result Date: 09/15/2023 CLINICAL DATA:  Acute nonlocalized abdominal pain. Chronic belly pain but  worsening today. Pain with urinating. EXAM: CT ABDOMEN AND PELVIS WITH CONTRAST TECHNIQUE: Multidetector CT imaging of the abdomen and pelvis was performed using the standard protocol following bolus administration of intravenous contrast. RADIATION DOSE REDUCTION: This exam was performed according to the departmental dose-optimization program which includes automated exposure control, adjustment of the mA and/or kV according to patient size and/or use of iterative reconstruction technique. CONTRAST:  75mL OMNIPAQUE IOHEXOL 350 MG/ML SOLN COMPARISON:  02/08/2020 FINDINGS: Lower chest: No acute abnormality. Hepatobiliary: Hepatic steatosis. Normal gallbladder. No biliary dilation. Pancreas: Unremarkable. Spleen: Unremarkable. Adrenals/Urinary Tract: Normal adrenal glands. No urinary calculi or hydronephrosis. Marked bladder wall thickening. Mucosal hyperenhancement throughout the bladder. Trace adjacent perivesical stranding. Stomach/Bowel: Normal caliber large and small bowel. No bowel wall thickening. The appendix is normal. Small hiatal hernia. Vascular/Lymphatic: No significant vascular findings are present. No enlarged abdominal or pelvic lymph nodes. Reproductive: Hysterectomy. Other: No free intraperitoneal fluid or air. Musculoskeletal: No acute fracture. IMPRESSION: 1. Findings compatible with cystitis. 2. Hepatic steatosis. Electronically Signed   By: Minerva Fester M.D.   On: 09/15/2023 21:00   DG Chest Port 1 View Result Date: 09/15/2023 CLINICAL DATA:  weakness EXAM: PORTABLE CHEST 1 VIEW COMPARISON:  December 27, 2006 FINDINGS: The cardiomediastinal silhouette is unchanged in contour.Similar appearance of biapical scarring when accounting for differences in technique. Atherosclerotic calcifications. No pleural effusion. No pneumothorax. Patchy LEFT basilar reticular nodularity IMPRESSION: Patchy LEFT basilar reticular nodularity, favored to reflect atelectasis. Electronically Signed   By: Meda Klinefelter M.D.   On: 09/15/2023 19:50    EKG: Independently reviewed.  Sinus or ectopic atrial tachycardia, no previous EKG for comparison.  Assessment and Plan  Acute cystitis Patient presenting with complaints of suprapubic pain and dysuria.  UA with evidence of pyuria and bacteriuria.  No signs of sepsis at this time.  CT showing findings compatible with cystitis.  Continue ceftriaxone and follow-up urine culture.  Hyponatremia Likely related to poor p.o. intake.  Sodium 128, no recent labs for comparison.  Continue gentle IV fluid hydration with normal saline and check serum osmolarity.  Continue to monitor BMP.  Mild hypokalemia Monitor potassium and magnesium levels, continue to replace as needed.  Slurred speech? Patient's speech is fluent and she has no focal neurodeficit on exam.  However, per triage note family was concerned about patient having slurred speech for the past 3 days.  Patient thinks her slurred speech at home might have been due to her mouth being too dry.  Stat brain MRI ordered to rule out stroke given risk factors.  Generalized weakness PT/OT eval, fall precautions.  Insulin-dependent type 2 diabetes Glucose in the 180s.  No previous A1c results in the chart.  Check A1c.  Placed on moderate sliding scale insulin ACHS and continue home basal insulin.  Hypertension Currently normotensive.  Hold antihypertensives until brain MRI is done.  DVT prophylaxis: SCDs until brain MRI is done. Code Status: DNR-prearrest interventions desired (discussed with the patient) Family Communication: No family available at this time. Level of care: Telemetry bed Admission status: It is my clinical opinion that referral for OBSERVATION is reasonable and necessary in this patient based on the above information provided. The aforementioned taken together are felt to place the patient at high risk for further clinical deterioration. However, it is anticipated that the patient may be  medically stable for discharge from the hospital within 24 to 48 hours.  John Giovanni MD [facility]  If 7PM-7AM, please contact night-coverage [email or web address]  09/16/2023, 3:57 AM

## 2023-09-16 NOTE — Progress Notes (Signed)
 New Admission Note:   Arrival Method: Via stretcher from ED Mental Orientation: A & O x 4 Telemetry: Box 5M08 - 1 Deg HB Assessment: Completed Skin:  Intact IV:  Left FA PIV Pain: See Pain Assessment Tubes:  N/A Safety Measures: Safety Fall Prevention Plan discussed with patient and family at the bedside Admission: Completed 5 MW Orientation: Patient has been orientated to the room, unit and staff.  Family:  Husband and Son, Maurice March, at the bedside.  Patient had her clothes and glasses at the bedside.  She left her bilateral hearing aids at home.  No medications brought to the hospital.  Patient uses a walker at home to ambulate due to bilateral chronic knee pain.  Orders have been reviewed and implemented. Will continue to monitor the patient. Call light has been placed within reach and bed alarm has been activated.   Bernie Covey RN Phone number: [phone]

## 2023-09-16 NOTE — Evaluation (Signed)
 Physical Therapy Evaluation  Patient Details Name: Dominique Simmons MRN: [id number] DOB: 03/11/1934 Today's Date: 09/16/2023  History of Present Illness  Pt is an 89 y/o female who presents 09/15/2023 with complaints of suprapubic abdominal pain and dysuria. Pt found to have UTI. Chest x-ray favored to reflect atelectasis. CT revealed cystitis. PMH significant for IDDM, HTN, hiatal hernia, Candida esophagitis, esophageal stricture s/p dilation.   Clinical Impression  Pt admitted with above diagnosis. Pt currently with functional limitations due to the deficits listed below (see PT Problem List). At the time of PT eval pt was able to perform bed mobility with mod I, transfers with min assist, and ambulation with CGA, with RW for support. Anticipate pt is near baseline of function, however has had a decline. Recommend post-acute rehab in the home to maximize functional independence, safety, and return to PLOF. Pt will benefit from acute skilled PT to increase their independence and safety with mobility to allow discharge.           If plan is discharge home, recommend the following: A little help with walking and/or transfers;A little help with bathing/dressing/bathroom;Assistance with cooking/housework;Assist for transportation;Help with stairs or ramp for entrance   Can travel by private vehicle        Equipment Recommendations None recommended by PT  Recommendations for Other Services       Functional Status Assessment Patient has had a recent decline in their functional status and demonstrates the ability to make significant improvements in function in a reasonable and predictable amount of time.     Precautions / Restrictions Precautions Precautions: Fall;Back Precaution Booklet Issued: No Precaution/Restrictions Comments: Back precautions for comfort due to reported history of "pinched sciatic nerve" Restrictions Weight Bearing Restrictions Per Provider Order: No      Mobility   Bed Mobility Overal bed mobility: Modified Independent             General bed mobility comments: Increased time and effort to transition to EOB but pt was able to complete without assistance.    Transfers Overall transfer level: Needs assistance Equipment used: Rolling walker (2 wheels) Transfers: Sit to/from Stand Sit to Stand: Min assist           General transfer comment: VC's for hand placement on seated surface for safety. Pt attmepted without assist x2 and on third attempt min assist provided to achieve full stand. Increased cues as pt backing up to sit at [facility] and then recliner at end of session as pt wanting to leave walker before she is positioned in front of seat fully.    Ambulation/Gait Ambulation/Gait assistance: Contact guard assist Gait Distance (Feet): 25 Feet Assistive device: Rolling walker (2 wheels) Gait Pattern/deviations: Step-through pattern, Decreased stride length, Trunk flexed, Narrow base of support Gait velocity: Decreased Gait velocity interpretation: <1.8 ft/sec, indicate of risk for recurrent falls   General Gait Details: Slow but generally steady with RW for support. Close, hands on guarding provided for safety. Around room only at this time. Pt with no significant gait deviations from reported knee issues. Pt does not complain of pain in the knees while ambulating.  Stairs            Wheelchair Mobility     Tilt Bed    Modified Rankin (Stroke Patients Only)       Balance Overall balance assessment: Needs assistance Sitting-balance support: Feet supported, No upper extremity supported Sitting balance-Leahy Scale: Fair     Standing balance support: Bilateral upper extremity  supported, Reliant on assistive device for balance, During functional activity Standing balance-Leahy Scale: Poor                               Pertinent Vitals/Pain Pain Assessment Pain Assessment: Faces Faces Pain Scale: Hurts even  more Pain Location: B knees; lower legs (R worse than L) Pain Descriptors / Indicators: Discomfort, Grimacing, Guarding Pain Intervention(s): Limited activity within patient's tolerance, Monitored during session, Repositioned    Home Living Family/patient expects to be discharged to:: Private residence Living Arrangements: Spouse/significant other Available Help at Discharge: Family;Available 24 hours/day Type of Home: House Home Access: Stairs to enter Entrance Stairs-Rails: None Entrance Stairs-Number of Steps: 1   Home Layout: Two level;Able to live on main level with bedroom/bathroom Home Equipment:  (3 wheeled walker)      Prior Function Prior Level of Function : Independent/Modified Independent;Driving             Mobility Comments: Using the 3-wheeled walker both in and out of the house ADLs Comments: Sponge bathing for a few days PTA due to weakness, but normally getting in the shower and not requiring assist for ADL's. Pt reports having to assist husband with ADL's at home     Extremity/Trunk Assessment   Upper Extremity Assessment Upper Extremity Assessment: Defer to OT evaluation    Lower Extremity Assessment Lower Extremity Assessment: RLE deficits/detail;LLE deficits/detail RLE Deficits / Details: Pt reports need for a knee replacement bilaterally. She states lower leg N/T from foot up to knee from sciatic issue on R, and similar symptoms on the L but not as intense. Unable to perform MMT due to knee pain and lower leg pain    Cervical / Trunk Assessment Cervical / Trunk Assessment: Kyphotic  Communication   Communication Communication: No apparent difficulties    Cognition Arousal: Alert Behavior During Therapy: WFL for tasks assessed/performed   PT - Cognitive impairments: No apparent impairments                         Following commands: Intact       Cueing Cueing Techniques: Verbal cues, Gestural cues     General Comments       Exercises     Assessment/Plan    PT Assessment Patient needs continued PT services  PT Problem List Decreased strength;Decreased range of motion;Decreased activity tolerance;Decreased balance;Decreased mobility;Decreased knowledge of use of DME;Decreased knowledge of precautions;Decreased safety awareness;Pain       PT Treatment Interventions DME instruction;Gait training;Stair training;Functional mobility training;Therapeutic activities;Therapeutic exercise;Balance training;Patient/family education    PT Goals (Current goals can be found in the Care Plan section)  Acute Rehab PT Goals Patient Stated Goal: Return home PT Goal Formulation: With patient/family Time For Goal Achievement: 09/23/23 Potential to Achieve Goals: Good    Frequency Min 1X/week     Co-evaluation PT/OT/SLP Co-Evaluation/Treatment: Yes Reason for Co-Treatment: For patient/therapist safety;To address functional/ADL transfers PT goals addressed during session: Mobility/safety with mobility;Balance;Proper use of DME;Strengthening/ROM         AM-PAC PT "6 Clicks" Mobility  Outcome Measure Help needed turning from your back to your side while in a flat bed without using bedrails?: None Help needed moving from lying on your back to sitting on the side of a flat bed without using bedrails?: None Help needed moving to and from a bed to a chair (including a wheelchair)?: A Little Help needed standing up  from a chair using your arms (e.g., wheelchair or bedside chair)?: A Little Help needed to walk in hospital room?: A Little Help needed climbing 3-5 steps with a railing? : A Little 6 Click Score: 20    End of Session Equipment Utilized During Treatment: Gait belt Activity Tolerance: Patient tolerated treatment well Patient left: in chair;with call bell/phone within reach;with family/visitor present Nurse Communication: Mobility status PT Visit Diagnosis: Unsteadiness on feet (R26.81);Pain Pain - Right/Left:   (Bilateral) Pain - part of body: Knee    Time: 1610-9604 PT Time Calculation (min) (ACUTE ONLY): 30 min   Charges:   PT Evaluation $PT Eval Moderate Complexity: 1 Mod   PT General Charges $$ ACUTE PT VISIT: 1 Visit         Conni Slipper, PT, DPT Acute Rehabilitation Services Secure Chat Preferred Office: [phone]   Marylynn Pearson 09/16/2023, 2:08 PM

## 2023-09-16 NOTE — TOC CM/SW Note (Signed)
 Transition of Care [facility]) - Inpatient Brief Assessment   Patient Details  Name: Dominique Simmons MRN: [id number] Date of Birth: December 22, 1933  [facility] CM/SW Contact:    Tom-Johnson, Hershal Coria, RN Phone Number: 09/16/2023, 3:34 PM   Clinical Narrative:  Patient presented to the ED with Suprapubic Abdominal pain and Dysuria. Admitted with Acute Cystitis, on IV abx.  Family reported Slurred Speech at home, this was not noticed by EMS or ED provider per notes.  From home with husband, has two supportive children. Modified independent, has a cane, walker and shower seat at home. Does not drive, husband and children drives.  PCP is Georgianne Fick, MD and uses [facility] on [address] in [place].   Home health recommended, CM spoke with patient's son, Maurice March and he has no preference. CM called in referral to [facility] and Amy voiced acceptance, info on AVS.   Patient not Medically ready for discharge.  CM will continue to follow as patient progresses with care towards discharge.               Transition of Care Asessment: Insurance and Status: Insurance coverage has been reviewed Patient has primary care physician: Yes Home environment has been reviewed: Yes Prior level of function:: Modified Independent Prior/Current Home Services: No current home services Social Drivers of Health Review: SDOH reviewed no interventions necessary Readmission risk has been reviewed: Yes Transition of care needs: transition of care needs identified, TOC will continue to follow

## 2023-09-16 NOTE — Evaluation (Signed)
 Occupational Therapy Evaluation Patient Details Name: Dominique Simmons MRN: [id number] DOB: 08/03/1933 Today's Date: 09/16/2023   History of Present Illness   Pt is an 89 y/o female who presents 09/15/2023 with complaints of suprapubic abdominal pain and dysuria. Pt found to have UTI. Chest x-ray favored to reflect atelectasis. CT revealed cystitis. PMH significant for IDDM, HTN, hiatal hernia, Candida esophagitis, esophageal stricture s/p dilation.     Clinical Impressions Prior to admission patient was performing bathing and dressing independently, however patient admitted that she had been reducing her activity.  Currently she is min A for adls.  Feel patient would benefit from home health OT for an environmental evaluation and to ensure patient returns to her PLOF.     If plan is discharge home, recommend the following:   A little help with walking and/or transfers;A little help with bathing/dressing/bathroom;Assistance with cooking/housework;Assist for transportation;Help with stairs or ramp for entrance     Functional Status Assessment   Patient has had a recent decline in their functional status and demonstrates the ability to make significant improvements in function in a reasonable and predictable amount of time.     Equipment Recommendations   None recommended by OT     Recommendations for Other Services         Precautions/Restrictions   Precautions Precautions: Fall;Back Precaution Booklet Issued: No Precaution/Restrictions Comments: Back precautions for comfort due to reported history of "pinched sciatic nerve" Restrictions Weight Bearing Restrictions Per Provider Order: No     Mobility Bed Mobility Overal bed mobility: Modified Independent             General bed mobility comments: Increased time and effort to transition to EOB but pt was able to complete without assistance.    Transfers Overall transfer level: Needs assistance Equipment  used: Rolling walker (2 wheels) Transfers: Sit to/from Stand Sit to Stand: Min assist           General transfer comment: VC's for hand placement on seated surface for safety. Pt attmepted without assist x2 and on third attempt min assist provided to achieve full stand.      Balance Overall balance assessment: Needs assistance Sitting-balance support: Feet supported, No upper extremity supported Sitting balance-Leahy Scale: Fair     Standing balance support: Bilateral upper extremity supported, Reliant on assistive device for balance, During functional activity Standing balance-Leahy Scale: Poor                             ADL either performed or assessed with clinical judgement   ADL Overall ADL's : Needs assistance/impaired Eating/Feeding: Independent   Grooming: Supervision/safety;Standing   Upper Body Bathing: Supervision/ safety;Sitting   Lower Body Bathing: Minimal assistance;Sit to/from stand   Upper Body Dressing : Supervision/safety;Sitting   Lower Body Dressing: Minimal assistance;Sit to/from stand   Toilet Transfer: Minimal assistance   Toileting- Clothing Manipulation and Hygiene: Minimal assistance       Functional mobility during ADLs: Minimal assistance General ADL Comments: Patient has significant pain / debility in B knees with reported sciatica down the RLE.  Patient also reporting that she has started sponge bathing and is sleeping a lot.     Vision Baseline Vision/History: 1 Wears glasses;0 No visual deficits Ability to See in Adequate Light: 0 Adequate Patient Visual Report: No change from baseline       Perception         Praxis  Pertinent Vitals/Pain Pain Assessment Pain Assessment: Faces Faces Pain Scale: Hurts even more Pain Location: B knees; lower legs (R worse than L) Pain Descriptors / Indicators: Discomfort, Grimacing, Guarding Pain Intervention(s): Limited activity within patient's tolerance      Extremity/Trunk Assessment Upper Extremity Assessment Upper Extremity Assessment: Overall WFL for tasks assessed;Generalized weakness   Lower Extremity Assessment Lower Extremity Assessment: Defer to PT evaluation RLE Deficits / Details: Pt reports need for a knee replacement bilaterally. She states lower leg N/T from foot up to knee from sciatic issue on R, and similar symptoms on the L but not as intense. Unable to perform MMT due to knee pain and lower leg pain   Cervical / Trunk Assessment Cervical / Trunk Assessment: Kyphotic   Communication Communication Communication: No apparent difficulties   Cognition Arousal: Alert Behavior During Therapy: WFL for tasks assessed/performed Cognition: No apparent impairments                               Following commands: Intact       Cueing  General Comments   Cueing Techniques: Verbal cues;Gestural cues  Family arrived at the end of the session   Exercises     Shoulder Instructions      Home Living Family/patient expects to be discharged to:: Private residence Living Arrangements: Spouse/significant other Available Help at Discharge: Family;Available 24 hours/day Type of Home: House Home Access: Stairs to enter [employer] of Steps: 1 Entrance Stairs-Rails: None Home Layout: Two level;Able to live on main level with bedroom/bathroom     Bathroom Shower/Tub: Producer, television/film/video: Handicapped height     Home Equipment: Shower seat (3 wheeled walker)          Prior Functioning/Environment Prior Level of Function : Independent/Modified Independent;Driving             Mobility Comments: Using the 3-wheeled walker both in and out of the house ADLs Comments: Sponge bathing for a few days PTA due to weakness, but normally getting in the shower and not requiring assist for ADL's. Pt reports having to assist husband with ADL's at home    OT Problem List: Decreased  activity tolerance;Impaired balance (sitting and/or standing);Decreased knowledge of use of DME or AE;Pain   OT Treatment/Interventions: Self-care/ADL training;DME and/or AE instruction;Therapeutic activities;Patient/family education      OT Goals(Current goals can be found in the care plan section)   Acute Rehab OT Goals OT Goal Formulation: With patient Time For Goal Achievement: 09/30/23 Potential to Achieve Goals: Good   OT Frequency:  Min 2X/week    Co-evaluation   Reason for Co-Treatment: For patient/therapist safety;To address functional/ADL transfers PT goals addressed during session: Mobility/safety with mobility;Balance;Proper use of DME;Strengthening/ROM        AM-PAC OT "6 Clicks" Daily Activity     Outcome Measure Help from another person eating meals?: None Help from another person taking care of personal grooming?: A Little Help from another person toileting, which includes using toliet, bedpan, or urinal?: A Little Help from another person bathing (including washing, rinsing, drying)?: A Little Help from another person to put on and taking off regular upper body clothing?: A Little Help from another person to put on and taking off regular lower body clothing?: A Little 6 Click Score: 19   End of Session Equipment Utilized During Treatment: Gait belt Nurse Communication: Mobility status  Activity Tolerance: Patient tolerated treatment well Patient left:  in chair;with call bell/phone within reach;with chair alarm set;with family/visitor present  OT Visit Diagnosis: Unsteadiness on feet (R26.81);Muscle weakness (generalized) (M62.81);Pain Pain - part of body: Knee                Time: 5643-3295 OT Time Calculation (min): 29 min Charges:  OT General Charges $OT Visit: 1 Visit OT Evaluation $OT Eval Low Complexity: 1 Low  Hal Neer OTR/L   Malachi Bonds 09/16/2023, 2:14 PM

## 2023-09-17 DIAGNOSIS — N3000 Acute cystitis without hematuria: Secondary | ICD-10-CM | POA: Diagnosis not present

## 2023-09-17 LAB — GLUCOSE, CAPILLARY
Glucose-Capillary: 99 mg/dL (ref 70–99)
Glucose-Capillary: 141 mg/dL — ABNORMAL HIGH (ref 70–99)
Glucose-Capillary: 114 mg/dL — ABNORMAL HIGH (ref 70–99)
Glucose-Capillary: 147 mg/dL — ABNORMAL HIGH (ref 70–99)

## 2023-09-17 LAB — CBC WITH DIFFERENTIAL/PLATELET
Abs Immature Granulocytes: 0.02 10*3/uL (ref 0.00–0.07)
Basophils Absolute: 0 10*3/uL (ref 0.0–0.1)
Basophils Relative: 0 %
Eosinophils Absolute: 0 10*3/uL (ref 0.0–0.5)
Eosinophils Relative: 0 %
HCT: 32.7 % — ABNORMAL LOW (ref 36.0–46.0)
Hemoglobin: 11.3 g/dL — ABNORMAL LOW (ref 12.0–15.0)
Immature Granulocytes: 0 %
Lymphocytes Relative: 16 %
Lymphs Abs: 0.8 10*3/uL (ref 0.7–4.0)
MCH: 30.4 pg (ref 26.0–34.0)
MCHC: 34.6 g/dL (ref 30.0–36.0)
MCV: 87.9 fL (ref 80.0–100.0)
Monocytes Absolute: 0.5 10*3/uL (ref 0.1–1.0)
Monocytes Relative: 10 %
Neutro Abs: 3.7 10*3/uL (ref 1.7–7.7)
Neutrophils Relative %: 74 %
Platelets: 128 10*3/uL — ABNORMAL LOW (ref 150–400)
RBC: 3.72 MIL/uL — ABNORMAL LOW (ref 3.87–5.11)
RDW: 13.1 % (ref 11.5–15.5)
WBC: 5.1 10*3/uL (ref 4.0–10.5)
nRBC: 0 % (ref 0.0–0.2)

## 2023-09-17 LAB — BASIC METABOLIC PANEL WITH GFR
Anion gap: 7 (ref 5–15)
BUN: 8 mg/dL (ref 8–23)
CO2: 22 mmol/L (ref 22–32)
Calcium: 8.1 mg/dL — ABNORMAL LOW (ref 8.9–10.3)
Chloride: 101 mmol/L (ref 98–111)
Creatinine, Ser: 0.83 mg/dL (ref 0.44–1.00)
GFR, Estimated: >60 mL/min (ref >60)
Glucose, Bld: 105 mg/dL — ABNORMAL HIGH (ref 70–99)
Potassium: 3.8 mmol/L (ref 3.5–5.1)
Sodium: 130 mmol/L — ABNORMAL LOW (ref 135–145)

## 2023-09-17 LAB — MAGNESIUM: Magnesium: 2.2 mg/dL (ref 1.7–2.4)

## 2023-09-17 MED ORDER — SODIUM CHLORIDE 0.9 % IV SOLN
500.0000 mg | INTRAVENOUS | Status: DC
  Administered 2023-09-17 – 2023-09-18 (×2): 500 mg via INTRAVENOUS
  Filled 2023-09-17 (×2): qty 5

## 2023-09-17 MED ORDER — SODIUM CHLORIDE 0.9 % IV SOLN: INTRAVENOUS | Status: DC

## 2023-09-17 NOTE — Progress Notes (Signed)
 Mobility Specialist Progress Note:   09/17/23 1210  Mobility  Activity Transferred to/from [facility];Transferred from bed to chair  Level of Assistance Contact guard assist, steadying assist  Assistive Device Front wheel walker  Distance Ambulated (ft) 8 ft  Activity Response Tolerated well  Mobility Referral Yes  Mobility visit 1 Mobility  Mobility Specialist Start Time (ACUTE ONLY) 1018  Mobility Specialist Stop Time (ACUTE ONLY) 1035  Mobility Specialist Time Calculation (min) (ACUTE ONLY) 17 min   Pt received in bed, agreeable to mobility. C/o slight dizziness when sitting EOB but stated that this resolved toward EOS. Pt only needing CG to stand and ambulate to [facility] for BM. Void successful. Assisted with pericare. Pt left in chair with call bell in reach and all needs met. Chair alarm on.   Leory Plowman  Mobility Specialist Please contact via SecureChat Rehab office at [phone]

## 2023-09-17 NOTE — Plan of Care (Signed)

## 2023-09-17 NOTE — Plan of Care (Signed)

## 2023-09-17 NOTE — Progress Notes (Signed)
 PROGRESS NOTE  Dominique Simmons ZOX:[id number] DOB: Jun 15, 1934 DOA: 09/15/2023 PCP: Georgianne Fick, MD  HPI/Recap of past 24 hours: Dominique Simmons is a 89 y.o. female with medical history significant of DM, HTN, HLD, IBS, GERD, hiatal hernia, Candida esophagitis, esophageal stricture status post dilation presented to the ED with complaints of suprapubic abdominal pain, dysuria and concern for slurred speech.  Per triage note, family reported slurred speech x 3 days but this was not noticed by EMS or ED provider.  Afebrile.  Labs notable for WBC count 11.8, sodium 128, potassium 3.1. UA with negative nitrite, large amount of leukocytes, and microscopy showing >50 WBCs and few bacteria. Chest x-ray showing patchy left basilar reticular nodularity favored to reflect atelectasis. CT abdomen pelvis showing findings compatible with cystitis and hepatic steatosis.  Patient admitted for further management.    Today, patient denies any new complaints, noted to have a low grade fever of 100.7 last night    Assessment/Plan: Principal Problem:   Acute cystitis Active Problems:   Hyponatremia   Hypokalemia   Slurred speech   Generalized weakness   Essential hypertension   Insulin dependent type 2 diabetes mellitus (HCC)   Acute cystitis Noted complaints of suprapubic pain and dysuria Currently afebrile, with no leukocytosis UA with evidence of pyuria and bacteriuria Urine culture growing 40,000 GBS BC X 2 pending CT findings compatible with cystitis Chest x-ray showing patchy left basilar reticular nodularity favored to reflect atelectasis Continue ceftriaxone, added azithromycin given low grade fever on 09/16/23   Hyponatremia Likely related to poor p.o. intake Gentle IV fluid hydration Daily BMP   Hypokalemia/hypomagnesemia Replace as needed   ?Slurred speech Patient's speech is fluent and she has no focal neurodeficit on exam. Patient thinks her slurred speech at home might  have been due to her mouth being too dry Brain MRI showed no acute findings, negative for infarct   Generalized weakness PT/OT eval, fall precautions   Insulin-dependent type 2 diabetes Last A1c 6.7 on 09/16/2023 SSI, Semglee, Accu-Cheks, hypoglycemic protocol   Hypertension Currently normotensive, BP stable Hold home HCTZ, losartan, amlodipine, may consider discontinuing hydrochlorothiazide upon discharge and monitoring BP closely    Estimated body mass index is 23.05 kg/m as calculated from the following:   Height as of this encounter: 5' 6.5" (1.689 m).   Weight as of this encounter: 65.8 kg.     Code Status: DNR  Family Communication: None at bedside  Disposition Plan: Status is: Inpatient        Consultants: None  Procedures: None  Antimicrobials: Ceftriaxone Azithromycin  DVT prophylaxis: Lovenox   Objective: Vitals:   09/16/23 2012 09/17/23 0503 09/17/23 0702 09/17/23 1627  BP: 131/64 (!) 118/58 (!) 139/54 (!) 141/71  Pulse: 79 71 70 72  Resp: 18 18  17   Temp: (!) 100.7 F (38.2 C) 98.1 F (36.7 C) 98.4 F (36.9 C) (!) 97.5 F (36.4 C)  TempSrc: Oral Oral Oral Oral  SpO2: 95% 97% 98% 99%  Weight:      Height:        Intake/Output Summary (Last 24 hours) at 09/17/2023 1849 Last data filed at 09/17/2023 0503 Gross per 24 hour  Intake --  Output 0 ml  Net 0 ml   Filed Weights   09/15/23 1911  Weight: 65.8 kg    Exam: General: NAD Cardiovascular: S1, S2 present Respiratory: CTAB Abdomen: Soft, nontender, nondistended, bowel sounds present Musculoskeletal: No bilateral pedal edema noted Skin: Normal Psychiatry: Normal mood  Data Reviewed: CBC: Recent Labs  Lab 09/15/23 1929 09/16/23 0919 09/17/23 0340  WBC 11.8* 6.3 5.1  NEUTROABS 10.9*  --  3.7  HGB 13.0 12.1 11.3*  HCT 37.0 35.6* 32.7*  MCV 87.1 89.0 87.9  PLT 187 152 128*   Basic Metabolic Panel: Recent Labs  Lab 09/15/23 1929 09/16/23 0919 09/17/23 0340  NA  128* 131* 130*  K 3.1* 3.4* 3.8  CL 94* 99 101  CO2 21* 24 22  GLUCOSE 181* 117* 105*  BUN 11 18 8   CREATININE 0.96 0.93 0.83  CALCIUM 8.8* 8.4* 8.1*  MG  --  1.4* 2.2   GFR: Estimated Creatinine Clearance: 44.7 mL/min (by C-G formula based on SCr of 0.83 mg/dL). Liver Function Tests: Recent Labs  Lab 09/15/23 1929  AST 36  ALT 24  ALKPHOS 43  BILITOT 0.9  PROT 6.3*  ALBUMIN 2.9*   Recent Labs  Lab 09/15/23 1929  LIPASE 29   No results for input(s): "AMMONIA" in the last 168 hours. Coagulation Profile: No results for input(s): "INR", "PROTIME" in the last 168 hours. Cardiac Enzymes: No results for input(s): "CKTOTAL", "CKMB", "CKMBINDEX", "TROPONINI" in the last 168 hours. BNP (last 3 results) No results for input(s): "PROBNP" in the last 8760 hours. HbA1C: Recent Labs    09/16/23 0919  HGBA1C 6.7*   CBG: Recent Labs  Lab 09/16/23 1646 09/16/23 2004 09/17/23 0703 09/17/23 1218 09/17/23 1621  GLUCAP 111* 180* 99 141* 114*   Lipid Profile: No results for input(s): "CHOL", "HDL", "LDLCALC", "TRIG", "CHOLHDL", "LDLDIRECT" in the last 72 hours. Thyroid Function Tests: No results for input(s): "TSH", "T4TOTAL", "FREET4", "T3FREE", "THYROIDAB" in the last 72 hours. Anemia Panel: No results for input(s): "VITAMINB12", "FOLATE", "FERRITIN", "TIBC", "IRON", "RETICCTPCT" in the last 72 hours. Urine analysis:    Component Value Date/Time   COLORURINE YELLOW 09/15/2023 1923   APPEARANCEUR CLOUDY (A) 09/15/2023 1923   LABSPEC 1.014 09/15/2023 1923   PHURINE 6.0 09/15/2023 1923   GLUCOSEU NEGATIVE 09/15/2023 1923   HGBUR SMALL (A) 09/15/2023 1923   BILIRUBINUR NEGATIVE 09/15/2023 1923   KETONESUR NEGATIVE 09/15/2023 1923   PROTEINUR 30 (A) 09/15/2023 1923   UROBILINOGEN 0.2 11/27/2006 0815   NITRITE NEGATIVE 09/15/2023 1923   LEUKOCYTESUR LARGE (A) 09/15/2023 1923   Sepsis Labs: @LABRCNTIP (procalcitonin:4,lacticidven:4)  ) Recent Results (from the past  240 hours)  Urine Culture     Status: Abnormal   Collection Time: 09/15/23  7:23 PM   Specimen: Urine, Random  Result Value Ref Range Status   Specimen Description URINE, RANDOM  Final   Special Requests NONE Reflexed from [id number]  Final   Culture (A)  Final    40,000 COLONIES/mL GROUP B STREP(S.AGALACTIAE)ISOLATED TESTING AGAINST S. AGALACTIAE NOT ROUTINELY PERFORMED DUE TO PREDICTABILITY OF AMP/PEN/VAN SUSCEPTIBILITY. Performed at [facility], [address], [place], [place] [address]    Report Status 09/16/2023 FINAL  Final      Studies: No results found.   Scheduled Meds:  enoxaparin (LOVENOX) injection  40 mg Subcutaneous Q24H   insulin aspart  0-15 Units Subcutaneous TID WC   insulin aspart  0-5 Units Subcutaneous QHS   insulin glargine-yfgn  12 Units Subcutaneous Daily    Continuous Infusions:  azithromycin 500 mg (09/17/23 0924)   cefTRIAXone (ROCEPHIN)  IV 1 g (09/16/23 2030)     LOS: 1 day     Dominique Cedar, MD [facility]  If 7PM-7AM, please contact night-coverage [email or web address] 09/17/2023, 6:49 PM

## 2023-09-18 DIAGNOSIS — E876 Hypokalemia: Secondary | ICD-10-CM | POA: Diagnosis not present

## 2023-09-18 DIAGNOSIS — E871 Hypo-osmolality and hyponatremia: Secondary | ICD-10-CM | POA: Diagnosis not present

## 2023-09-18 LAB — CBC WITH DIFFERENTIAL/PLATELET
Abs Immature Granulocytes: 0.01 10*3/uL (ref 0.00–0.07)
Basophils Absolute: 0 10*3/uL (ref 0.0–0.1)
Basophils Relative: 0 %
Eosinophils Absolute: 0.1 10*3/uL (ref 0.0–0.5)
Eosinophils Relative: 2 %
HCT: 32.7 % — ABNORMAL LOW (ref 36.0–46.0)
Hemoglobin: 11.4 g/dL — ABNORMAL LOW (ref 12.0–15.0)
Immature Granulocytes: 0 %
Lymphocytes Relative: 25 %
Lymphs Abs: 1.4 10*3/uL (ref 0.7–4.0)
MCH: 31.1 pg (ref 26.0–34.0)
MCHC: 34.9 g/dL (ref 30.0–36.0)
MCV: 89.3 fL (ref 80.0–100.0)
Monocytes Absolute: 0.8 10*3/uL (ref 0.1–1.0)
Monocytes Relative: 14 %
Neutro Abs: 3.3 10*3/uL (ref 1.7–7.7)
Neutrophils Relative %: 59 %
Platelets: 144 10*3/uL — ABNORMAL LOW (ref 150–400)
RBC: 3.66 MIL/uL — ABNORMAL LOW (ref 3.87–5.11)
RDW: 13.2 % (ref 11.5–15.5)
WBC: 5.5 10*3/uL (ref 4.0–10.5)
nRBC: 0 % (ref 0.0–0.2)

## 2023-09-18 LAB — BASIC METABOLIC PANEL WITH GFR
Anion gap: 8 (ref 5–15)
BUN: 7 mg/dL — ABNORMAL LOW (ref 8–23)
CO2: 24 mmol/L (ref 22–32)
Calcium: 8.3 mg/dL — ABNORMAL LOW (ref 8.9–10.3)
Chloride: 101 mmol/L (ref 98–111)
Creatinine, Ser: 0.83 mg/dL (ref 0.44–1.00)
GFR, Estimated: >60 mL/min (ref >60)
Glucose, Bld: 105 mg/dL — ABNORMAL HIGH (ref 70–99)
Potassium: 3.9 mmol/L (ref 3.5–5.1)
Sodium: 133 mmol/L — ABNORMAL LOW (ref 135–145)

## 2023-09-18 LAB — GLUCOSE, CAPILLARY
Glucose-Capillary: 95 mg/dL (ref 70–99)
Glucose-Capillary: 182 mg/dL — ABNORMAL HIGH (ref 70–99)

## 2023-09-18 MED ORDER — AZITHROMYCIN 500 MG PO TABS: 500.0000 mg | ORAL_TABLET | Freq: Every day | ORAL | Status: DC

## 2023-09-18 NOTE — Progress Notes (Signed)
 Physical Therapy Treatment  Patient Details Name: Dominique Simmons MRN: [id number] DOB: 06/06/1934 Today's Date: 09/18/2023   History of Present Illness Pt is an 89 y/o female who presents 09/15/2023 with complaints of suprapubic abdominal pain and dysuria. Pt found to have UTI. Chest x-ray favored to reflect atelectasis. CT revealed cystitis. PMH significant for IDDM, HTN, hiatal hernia, Candida esophagitis, esophageal stricture s/p dilation.    PT Comments  Pt progressing towards physical therapy goals. Assisted pt to don briefs and pajama pants prior to ambulation in the hall. Noted a large, patchy, red area on L flank around to back. Pt with no complaints of area, however reported to RN and she was present to observe and assess the area. Pt appears to be deconditioned from initial evaluation, however anticipate she will progress well with home health therapies to follow up at d/c. Pt anticipates d/c home this afternoon.  Will continue to follow.    If plan is discharge home, recommend the following: A little help with walking and/or transfers;A little help with bathing/dressing/bathroom;Assistance with cooking/housework;Assist for transportation;Help with stairs or ramp for entrance   Can travel by private vehicle        Equipment Recommendations  None recommended by PT    Recommendations for Other Services       Precautions / Restrictions Precautions Precautions: Fall;Back Precaution Booklet Issued: No Precaution/Restrictions Comments: Back precautions for comfort due to reported history of "pinched sciatic nerve" Restrictions Weight Bearing Restrictions Per Provider Order: No     Mobility  Bed Mobility Overal bed mobility: Modified Independent             General bed mobility comments: Increased time and effort to transition to EOB but pt was able to complete without assistance.    Transfers Overall transfer level: Needs assistance Equipment used: Rolling walker (2  wheels) Transfers: Sit to/from Stand Sit to Stand: Min assist           General transfer comment: VC's for hand placement on seated surface for safety. Light assist to power up to full stand.    Ambulation/Gait Ambulation/Gait assistance: Contact guard assist Gait Distance (Feet): 200 Feet Assistive device: Rolling walker (2 wheels) Gait Pattern/deviations: Step-through pattern, Decreased stride length, Trunk flexed, Narrow base of support Gait velocity: Decreased Gait velocity interpretation: 1.31 - 2.62 ft/sec, indicative of limited community ambulator   General Gait Details: Slow but generally steady. Pt able to progress distance, however slightly dyspneic upon return to room. Recovered quickly with standing rest break.   Stairs             Wheelchair Mobility     Tilt Bed    Modified Rankin (Stroke Patients Only)       Balance Overall balance assessment: Needs assistance Sitting-balance support: Feet supported, No upper extremity supported Sitting balance-Leahy Scale: Fair     Standing balance support: Bilateral upper extremity supported, Reliant on assistive device for balance, During functional activity Standing balance-Leahy Scale: Poor                              Communication Communication Communication: No apparent difficulties  Cognition Arousal: Alert Behavior During Therapy: WFL for tasks assessed/performed   PT - Cognitive impairments: No apparent impairments                         Following commands: Intact      Cueing Cueing  Techniques: Verbal cues, Gestural cues  Exercises      General Comments General comments (skin integrity, edema, etc.): Noted a large, red, patchy area on L flank. RN notified and present to observe. Pt does not report area is itchy.      Pertinent Vitals/Pain Pain Assessment Pain Assessment: Faces Faces Pain Scale: Hurts a little bit Pain Location: B knees; lower legs (R worse than  L) Pain Descriptors / Indicators: Discomfort, Grimacing, Guarding Pain Intervention(s): Limited activity within patient's tolerance, Monitored during session, Repositioned    Home Living                          Prior Function            PT Goals (current goals can now be found in the care plan section) Acute Rehab PT Goals Patient Stated Goal: Return home PT Goal Formulation: With patient/family Time For Goal Achievement: 09/23/23 Potential to Achieve Goals: Good Progress towards PT goals: Progressing toward goals    Frequency    Min 1X/week      PT Plan      Co-evaluation              AM-PAC PT "6 Clicks" Mobility   Outcome Measure  Help needed turning from your back to your side while in a flat bed without using bedrails?: None Help needed moving from lying on your back to sitting on the side of a flat bed without using bedrails?: None Help needed moving to and from a bed to a chair (including a wheelchair)?: A Little Help needed standing up from a chair using your arms (e.g., wheelchair or bedside chair)?: A Little Help needed to walk in hospital room?: A Little Help needed climbing 3-5 steps with a railing? : A Little 6 Click Score: 20    End of Session Equipment Utilized During Treatment: Gait belt Activity Tolerance: Patient tolerated treatment well Patient left: in chair;with call bell/phone within reach;with family/visitor present Nurse Communication: Mobility status PT Visit Diagnosis: Unsteadiness on feet (R26.81);Pain Pain - Right/Left:  (Bilateral) Pain - part of body: Knee     Time: 4332-9518 PT Time Calculation (min) (ACUTE ONLY): 27 min  Charges:    $Gait Training: 23-37 mins PT General Charges $$ ACUTE PT VISIT: 1 Visit                     Conni Slipper, PT, DPT Acute Rehabilitation Services Secure Chat Preferred Office: [phone]    Dominique Simmons 09/18/2023, 3:14 PM

## 2023-09-18 NOTE — Hospital Course (Signed)
 89 y.o. female with medical history significant of DM, HTN, HLD, IBS, GERD, hiatal hernia, Candida esophagitis, esophageal stricture status post dilation presented to the ED with complaints of suprapubic abdominal pain, dysuria and concern for slurred speech.  Per triage note, family reported slurred speech x 3 days but this was not noticed by EMS or ED provider.  Afebrile.  Labs notable for WBC count 11.8, sodium 128, potassium 3.1. UA with negative nitrite, large amount of leukocytes, and microscopy showing >50 WBCs and few bacteria. Chest x-ray showing patchy left basilar reticular nodularity favored to reflect atelectasis. CT abdomen pelvis showing findings compatible with cystitis and hepatic steatosis.  Patient admitted for further management.

## 2023-09-18 NOTE — TOC Transition Note (Signed)
 Transition of Care [facility]) - Discharge Note   Patient Details  Name: Dominique Simmons MRN: [id number] Date of Birth: August 13, 1933  [facility] CM/SW Contact:  Tom-Johnson, Hershal Coria, RN Phone Number: 09/18/2023, 1:58 PM   Clinical Narrative:     Patient is scheduled for discharge today.  Readmission Risk Assessment done. Home health info, hospital f/u and discharge instructions on AVS. Son, Maurice March to transport at discharge.  No further TOC needs noted.        Final next level of care: Home w Home Health Services Barriers to Discharge: Barriers Resolved   Patient Goals and CMS Choice Patient states their goals for this hospitalization and ongoing recovery are:: To return home CMS Medicare.gov Compare Post Acute Care list provided to:: Patient Choice offered to / list presented to : Patient, Adult Children (Son, Maurice March)      Discharge Placement                Patient to be transferred to facility by: Son Name of family member notified: [facility]    Discharge Plan and Services Additional resources added to the After Visit Summary for     Discharge Planning Services: CM Consult Post Acute Care Choice: Home Health          DME Arranged: N/A DME Agency: NA       HH Arranged: PT, OT (Disease Management) HH Agency: [facility] Date [facility] Agency Contacted: 09/16/23 Time HH Agency Contacted: 1530 Representative spoke with at [facility] Agency: Amy  Social Drivers of Health (SDOH) Interventions SDOH Screenings   Food Insecurity: No Food Insecurity (09/16/2023)  Housing: Low Risk  (09/16/2023)  Transportation Needs: No Transportation Needs (09/16/2023)  Utilities: Not At Risk (09/16/2023)  Social Connections: Socially Integrated (09/16/2023)  Tobacco Use: Low Risk  (09/16/2023)     Readmission Risk Interventions    09/16/2023    3:33 PM  Readmission Risk Prevention Plan  Post Dischage Appt Complete  Medication Screening Complete  Transportation Screening  Complete

## 2023-09-18 NOTE — Discharge Summary (Signed)
 Physician Discharge Summary   Patient: Dominique Simmons MRN: [id number] DOB: 07/24/1933  Admit date:     09/15/2023  Discharge date: 09/18/23  Discharge Physician: Rickey Barbara   PCP: Georgianne Fick, MD   Recommendations at discharge:    Follow up with PCP in 1-2 weeks Recheck bmet within one week, focus on Na Consider stopping HCTZ  Discharge Diagnoses: Principal Problem:   Acute cystitis Active Problems:   Hyponatremia   Hypokalemia   Slurred speech   Generalized weakness   Essential hypertension   Insulin dependent type 2 diabetes mellitus (HCC)  Resolved Problems:   * No resolved hospital problems. *  Hospital Course: 89 y.o. female with medical history significant of DM, HTN, HLD, IBS, GERD, hiatal hernia, Candida esophagitis, esophageal stricture status post dilation presented to the ED with complaints of suprapubic abdominal pain, dysuria and concern for slurred speech.  Per triage note, family reported slurred speech x 3 days but this was not noticed by EMS or ED provider.  Afebrile.  Labs notable for WBC count 11.8, sodium 128, potassium 3.1. UA with negative nitrite, large amount of leukocytes, and microscopy showing >50 WBCs and few bacteria. Chest x-ray showing patchy left basilar reticular nodularity favored to reflect atelectasis. CT abdomen pelvis showing findings compatible with cystitis and hepatic steatosis.  Patient admitted for further management.   Assessment and Plan: Acute cystitis Noted complaints of suprapubic pain and dysuria Currently afebrile, with no leukocytosis UA with evidence of pyuria and bacteriuria Urine culture growing 40,000 GBS BC X 2 neg CT findings compatible with cystitis Chest x-ray showing patchy left basilar reticular nodularity favored to reflect atelectasis Discussed with pharmacy who involved ID pharmacy. Recommendation that infection should be appropriately treated with admitting abx. No further abx necessary on d/c    Hyponatremia Likely related to poor p.o. intake Given gentle IV fluid hydration Daily BMP   Hypokalemia/hypomagnesemia Replace as needed   ?Slurred speech Patient's speech is fluent and she has no focal neurodeficit on exam. Brain MRI showed no acute findings, negative for infarct   Generalized weakness PT/OT eval, fall precautions   Insulin-dependent type 2 diabetes Last A1c 6.7 on 09/16/2023 SSI, Semglee, Accu-Cheks, hypoglycemic protocol   Hypertension Currently normotensive, BP stable Held home HCTZ, losartan, amlodipine, Cont home meds on dc       Consultants:  Procedures performed:   Disposition: Home Diet recommendation:  Carb modified diet DISCHARGE MEDICATION: Allergies as of 09/18/2023       Reactions   Amoxicillin    REACTION: rash   Lescol [fluvastatin Sodium]    Myalgia   Lipitor [atorvastatin]    Myalgia   Lisinopril    Cough   Pravachol [pravastatin Sodium]    Myalgia        Medication List     TAKE these medications    acetaminophen 325 MG tablet Commonly known as: TYLENOL Take 650 mg by mouth every 6 (six) hours as needed for mild pain (pain score 1-3).   amLODipine 5 MG tablet Commonly known as: NORVASC Take 5 mg by mouth daily.   aspirin 81 MG tablet Take 81 mg by mouth daily.   clotrimazole-betamethasone cream Commonly known as: LOTRISONE Apply 1 Application topically daily as needed (for rash).   COD LIVER OIL PO Take 1 tablet by mouth daily at 6 (six) AM.   hydrochlorothiazide 25 MG tablet Commonly known as: HYDRODIURIL Take 25 mg by mouth daily.   losartan 25 MG tablet Commonly known as: COZAAR  Take 25 mg by mouth daily.   meloxicam 15 MG tablet Commonly known as: MOBIC Take 15 mg by mouth daily.   metFORMIN 500 MG tablet Commonly known as: GLUCOPHAGE Take 2 tablets by mouth every evening.   Toujeo SoloStar 300 UNIT/ML Solostar Pen Generic drug: insulin glargine (1 Unit Dial) Inject 12 Units into the skin  daily.        Follow-up Information     [facility] Follow up.   Specialty: Home Health Services Why: Someone will call you to schedule first home visit. Contact information: [address] [place] [address] [phone]         Georgianne Fick, MD Follow up in 2 week(s).   Specialty: Internal Medicine Why: Hospital follow up Contact information: [address] [place] [address] [place] [address] [phone]                Discharge Exam: Ceasar Mons Weights   09/15/23 1911  Weight: 65.8 kg   General exam: Conversant, in no acute distress Respiratory system: normal chest rise, clear, no audible wheezing Cardiovascular system: regular rhythm, s1-s2 Gastrointestinal system: Nondistended, nontender, pos BS Central nervous system: No seizures, no tremors Extremities: No cyanosis, no joint deformities Skin: No rashes, no pallor Psychiatry: Affect normal // no auditory hallucinations   Condition at discharge: fair  The results of significant diagnostics from this hospitalization (including imaging, microbiology, ancillary and laboratory) are listed below for reference.   Imaging Studies: MR BRAIN WO CONTRAST Result Date: 09/16/2023 CLINICAL DATA:  TIA EXAM: MRI HEAD WITHOUT CONTRAST TECHNIQUE: Multiplanar, multiecho pulse sequences of the brain and surrounding structures were obtained without intravenous contrast. COMPARISON:  Head CT 08/23/2011 FINDINGS: Brain: CSF signal widening at the lower left CP angle cistern measuring 2.3 cm in length, stable from 2013. Mild for age cerebral volume loss and chronic small vessel ischemia. No acute or subacute infarct, hemorrhage, hydrocephalus, or brain mass. Vascular: Normal flow voids. Skull and upper cervical spine: Normal marrow signal. Sinuses/Orbits: Negative. IMPRESSION: No acute finding.  Negative for infarct. Electronically Signed   By: Tiburcio Pea M.D.   On: 09/16/2023 08:10   CT ABDOMEN PELVIS  W CONTRAST Result Date: 09/15/2023 CLINICAL DATA:  Acute nonlocalized abdominal pain. Chronic belly pain but worsening today. Pain with urinating. EXAM: CT ABDOMEN AND PELVIS WITH CONTRAST TECHNIQUE: Multidetector CT imaging of the abdomen and pelvis was performed using the standard protocol following bolus administration of intravenous contrast. RADIATION DOSE REDUCTION: This exam was performed according to the departmental dose-optimization program which includes automated exposure control, adjustment of the mA and/or kV according to patient size and/or use of iterative reconstruction technique. CONTRAST:  75mL OMNIPAQUE IOHEXOL 350 MG/ML SOLN COMPARISON:  02/08/2020 FINDINGS: Lower chest: No acute abnormality. Hepatobiliary: Hepatic steatosis. Normal gallbladder. No biliary dilation. Pancreas: Unremarkable. Spleen: Unremarkable. Adrenals/Urinary Tract: Normal adrenal glands. No urinary calculi or hydronephrosis. Marked bladder wall thickening. Mucosal hyperenhancement throughout the bladder. Trace adjacent perivesical stranding. Stomach/Bowel: Normal caliber large and small bowel. No bowel wall thickening. The appendix is normal. Small hiatal hernia. Vascular/Lymphatic: No significant vascular findings are present. No enlarged abdominal or pelvic lymph nodes. Reproductive: Hysterectomy. Other: No free intraperitoneal fluid or air. Musculoskeletal: No acute fracture. IMPRESSION: 1. Findings compatible with cystitis. 2. Hepatic steatosis. Electronically Signed   By: Minerva Fester M.D.   On: 09/15/2023 21:00   DG Chest Port 1 View Result Date: 09/15/2023 CLINICAL DATA:  weakness EXAM: PORTABLE CHEST 1 VIEW COMPARISON:  December 27, 2006  FINDINGS: The cardiomediastinal silhouette is unchanged in contour.Similar appearance of biapical scarring when accounting for differences in technique. Atherosclerotic calcifications. No pleural effusion. No pneumothorax. Patchy LEFT basilar reticular nodularity IMPRESSION: Patchy  LEFT basilar reticular nodularity, favored to reflect atelectasis. Electronically Signed   By: Meda Klinefelter M.D.   On: 09/15/2023 19:50    Microbiology: Results for orders placed or performed during the hospital encounter of 09/15/23  Urine Culture     Status: Abnormal   Collection Time: 09/15/23  7:23 PM   Specimen: Urine, Random  Result Value Ref Range Status   Specimen Description URINE, RANDOM  Final   Special Requests NONE Reflexed from [id number]  Final   Culture (A)  Final    40,000 COLONIES/mL GROUP B STREP(S.AGALACTIAE)ISOLATED TESTING AGAINST S. AGALACTIAE NOT ROUTINELY PERFORMED DUE TO PREDICTABILITY OF AMP/PEN/VAN SUSCEPTIBILITY. Performed at [facility], [address], [place], [place] [address]    Report Status 09/16/2023 FINAL  Final  Culture, blood (Routine X 2) w Reflex to ID Panel     Status: None (Preliminary result)   Collection Time: 09/17/23  8:44 AM   Specimen: BLOOD RIGHT ARM  Result Value Ref Range Status   Specimen Description BLOOD RIGHT ARM  Final   Special Requests   Final    BOTTLES DRAWN AEROBIC AND ANAEROBIC Blood Culture results may not be optimal due to an inadequate volume of blood received in culture bottles   Culture   Final    NO GROWTH < 24 HOURS Performed at [facility], [address], [place], [place] [address]    Report Status PENDING  Incomplete  Culture, blood (Routine X 2) w Reflex to ID Panel     Status: None (Preliminary result)   Collection Time: 09/17/23  8:50 AM   Specimen: BLOOD RIGHT HAND  Result Value Ref Range Status   Specimen Description BLOOD RIGHT HAND  Final   Special Requests   Final    BOTTLES DRAWN AEROBIC AND ANAEROBIC Blood Culture adequate volume   Culture   Final    NO GROWTH < 24 HOURS Performed at [facility], [address], [place], [place] [address]    Report Status PENDING  Incomplete    Labs: CBC: Recent Labs  Lab 09/15/23 1929 09/16/23 0919 09/17/23 0340 09/18/23 0328   WBC 11.8* 6.3 5.1 5.5  NEUTROABS 10.9*  --  3.7 3.3  HGB 13.0 12.1 11.3* 11.4*  HCT 37.0 35.6* 32.7* 32.7*  MCV 87.1 89.0 87.9 89.3  PLT 187 152 128* 144*   Basic Metabolic Panel: Recent Labs  Lab 09/15/23 1929 09/16/23 0919 09/17/23 0340 09/18/23 0328  NA 128* 131* 130* 133*  K 3.1* 3.4* 3.8 3.9  CL 94* 99 101 101  CO2 21* 24 22 24   GLUCOSE 181* 117* 105* 105*  BUN 11 18 8  7*  CREATININE 0.96 0.93 0.83 0.83  CALCIUM 8.8* 8.4* 8.1* 8.3*  MG  --  1.4* 2.2  --    Liver Function Tests: Recent Labs  Lab 09/15/23 1929  AST 36  ALT 24  ALKPHOS 43  BILITOT 0.9  PROT 6.3*  ALBUMIN 2.9*   CBG: Recent Labs  Lab 09/17/23 1218 09/17/23 1621 09/17/23 2026 09/18/23 0739 09/18/23 1121  GLUCAP 141* 114* 147* 95 182*    Discharge time spent: less than 30 minutes.  Signed: Rickey Barbara, MD [facility] 09/18/2023

## 2023-09-18 NOTE — Progress Notes (Signed)
 Mobility Specialist Progress Note:    09/18/23 1000  Mobility  Activity Transferred from bed to chair  Level of Assistance Minimal assist, patient does 75% or more  Assistive Device Front wheel walker  Distance Ambulated (ft) 4 ft  Activity Response Tolerated well  Mobility Referral Yes  Mobility visit 1 Mobility  Mobility Specialist Start Time (ACUTE ONLY) [id number]  Mobility Specialist Stop Time (ACUTE ONLY) 0948  Mobility Specialist Time Calculation (min) (ACUTE ONLY) 10 min   Pt received in bed and agreeable. C/o some dizziness upon sitting EOB. Reported feeling better after sitting for a bit before standing. Required minA to stand and take steps to chair. No complaints. Pt left in chair with call bell and all needs met. Chair alarm on.  Dominique Simmons Mobility Specialist Please contact via Special educational needs teacher or Rehab office at [phone]

## 2023-09-19 ENCOUNTER — Telehealth: Payer: Self-pay

## 2023-09-19 NOTE — Transitions of Care (Post Inpatient/ED Visit) (Signed)
   09/19/2023  Name: Dominique Simmons MRN: [id number] DOB: 03-31-1934  Today's TOC FU Call Status: Today's TOC FU Call Status:: Unsuccessful Call (2nd Attempt) Unsuccessful Call (1st Attempt) Date: 09/19/23 Unsuccessful Call (2nd Attempt) Date: 09/19/23  Attempted to reach the patient regarding the most recent Inpatient/ED visit.  Follow Up Plan: Additional outreach attempts will be made to reach the patient to complete the Transitions of Care (Post Inpatient/ED visit) call.   Deidre Ala, BSN, RN [place]  [facility] - [employer] [facility] RN Care Manager [phone]

## 2023-09-19 NOTE — Transitions of Care (Post Inpatient/ED Visit) (Signed)
   09/19/2023  Name: Dominique Simmons MRN: [id number] DOB: 07-05-1933  Today's TOC FU Call Status: Today's TOC FU Call Status:: Unsuccessful Call (1st Attempt) Unsuccessful Call (1st Attempt) Date: 09/19/23  Attempted to reach the patient regarding the most recent Inpatient/ED visit.  Follow Up Plan: Additional outreach attempts will be made to reach the patient to complete the Transitions of Care (Post Inpatient/ED visit) call.   Deidre Ala, BSN, RN [place]  [facility] - [employer] [facility] RN Care Manager [phone]

## 2023-09-20 ENCOUNTER — Telehealth: Payer: Self-pay | Admitting: *Deleted

## 2023-09-20 DIAGNOSIS — E785 Hyperlipidemia, unspecified: Secondary | ICD-10-CM | POA: Diagnosis not present

## 2023-09-20 DIAGNOSIS — Z794 Long term (current) use of insulin: Secondary | ICD-10-CM | POA: Diagnosis not present

## 2023-09-20 DIAGNOSIS — M1990 Unspecified osteoarthritis, unspecified site: Secondary | ICD-10-CM | POA: Diagnosis not present

## 2023-09-20 DIAGNOSIS — N3000 Acute cystitis without hematuria: Secondary | ICD-10-CM | POA: Diagnosis not present

## 2023-09-20 DIAGNOSIS — K219 Gastro-esophageal reflux disease without esophagitis: Secondary | ICD-10-CM | POA: Diagnosis not present

## 2023-09-20 DIAGNOSIS — E1136 Type 2 diabetes mellitus with diabetic cataract: Secondary | ICD-10-CM | POA: Diagnosis not present

## 2023-09-20 DIAGNOSIS — K589 Irritable bowel syndrome without diarrhea: Secondary | ICD-10-CM | POA: Diagnosis not present

## 2023-09-20 DIAGNOSIS — I10 Essential (primary) hypertension: Secondary | ICD-10-CM | POA: Diagnosis not present

## 2023-09-20 DIAGNOSIS — K449 Diaphragmatic hernia without obstruction or gangrene: Secondary | ICD-10-CM | POA: Diagnosis not present

## 2023-09-20 NOTE — Transitions of Care (Post Inpatient/ED Visit) (Signed)
   09/20/2023  Name: Dominique Simmons MRN: [id number] DOB: 07/14/1933  Today's TOC FU Call Status: Today's TOC FU Call Status:: Unsuccessful Call (3rd Attempt) Unsuccessful Call (3rd Attempt) Date: 09/20/23  Attempted to reach the patient regarding the most recent Inpatient/ED visit.  Follow Up Plan: No further outreach attempts will be made at this time. We have been unable to contact the patient.  Irving Shows [facility], BSN RN Care Manager/ Transition of Care [place]/ [facility] [phone]

## 2023-09-22 LAB — CULTURE, BLOOD (ROUTINE X 2)
Culture: NO GROWTH
Culture: NO GROWTH
Special Requests: ADEQUATE

## 2023-09-24 DIAGNOSIS — K589 Irritable bowel syndrome without diarrhea: Secondary | ICD-10-CM | POA: Diagnosis not present

## 2023-09-24 DIAGNOSIS — E785 Hyperlipidemia, unspecified: Secondary | ICD-10-CM | POA: Diagnosis not present

## 2023-09-24 DIAGNOSIS — K219 Gastro-esophageal reflux disease without esophagitis: Secondary | ICD-10-CM | POA: Diagnosis not present

## 2023-09-24 DIAGNOSIS — Z794 Long term (current) use of insulin: Secondary | ICD-10-CM | POA: Diagnosis not present

## 2023-09-24 DIAGNOSIS — E1136 Type 2 diabetes mellitus with diabetic cataract: Secondary | ICD-10-CM | POA: Diagnosis not present

## 2023-09-24 DIAGNOSIS — I10 Essential (primary) hypertension: Secondary | ICD-10-CM | POA: Diagnosis not present

## 2023-09-24 DIAGNOSIS — M1990 Unspecified osteoarthritis, unspecified site: Secondary | ICD-10-CM | POA: Diagnosis not present

## 2023-09-24 DIAGNOSIS — N3000 Acute cystitis without hematuria: Secondary | ICD-10-CM | POA: Diagnosis not present

## 2023-09-24 DIAGNOSIS — K449 Diaphragmatic hernia without obstruction or gangrene: Secondary | ICD-10-CM | POA: Diagnosis not present

## 2023-09-25 DIAGNOSIS — M1990 Unspecified osteoarthritis, unspecified site: Secondary | ICD-10-CM | POA: Diagnosis not present

## 2023-09-25 DIAGNOSIS — E1136 Type 2 diabetes mellitus with diabetic cataract: Secondary | ICD-10-CM | POA: Diagnosis not present

## 2023-09-25 DIAGNOSIS — K589 Irritable bowel syndrome without diarrhea: Secondary | ICD-10-CM | POA: Diagnosis not present

## 2023-09-25 DIAGNOSIS — I10 Essential (primary) hypertension: Secondary | ICD-10-CM | POA: Diagnosis not present

## 2023-09-25 DIAGNOSIS — N3000 Acute cystitis without hematuria: Secondary | ICD-10-CM | POA: Diagnosis not present

## 2023-09-25 DIAGNOSIS — Z794 Long term (current) use of insulin: Secondary | ICD-10-CM | POA: Diagnosis not present

## 2023-09-25 DIAGNOSIS — K219 Gastro-esophageal reflux disease without esophagitis: Secondary | ICD-10-CM | POA: Diagnosis not present

## 2023-09-25 DIAGNOSIS — E785 Hyperlipidemia, unspecified: Secondary | ICD-10-CM | POA: Diagnosis not present

## 2023-09-25 DIAGNOSIS — K449 Diaphragmatic hernia without obstruction or gangrene: Secondary | ICD-10-CM | POA: Diagnosis not present

## 2023-09-26 DIAGNOSIS — K219 Gastro-esophageal reflux disease without esophagitis: Secondary | ICD-10-CM | POA: Diagnosis not present

## 2023-09-26 DIAGNOSIS — E1136 Type 2 diabetes mellitus with diabetic cataract: Secondary | ICD-10-CM | POA: Diagnosis not present

## 2023-09-26 DIAGNOSIS — N3000 Acute cystitis without hematuria: Secondary | ICD-10-CM | POA: Diagnosis not present

## 2023-09-26 DIAGNOSIS — I10 Essential (primary) hypertension: Secondary | ICD-10-CM | POA: Diagnosis not present

## 2023-09-26 DIAGNOSIS — K449 Diaphragmatic hernia without obstruction or gangrene: Secondary | ICD-10-CM | POA: Diagnosis not present

## 2023-09-26 DIAGNOSIS — K589 Irritable bowel syndrome without diarrhea: Secondary | ICD-10-CM | POA: Diagnosis not present

## 2023-09-26 DIAGNOSIS — E785 Hyperlipidemia, unspecified: Secondary | ICD-10-CM | POA: Diagnosis not present

## 2023-09-26 DIAGNOSIS — Z794 Long term (current) use of insulin: Secondary | ICD-10-CM | POA: Diagnosis not present

## 2023-09-26 DIAGNOSIS — M1990 Unspecified osteoarthritis, unspecified site: Secondary | ICD-10-CM | POA: Diagnosis not present

## 2023-09-30 DIAGNOSIS — M1990 Unspecified osteoarthritis, unspecified site: Secondary | ICD-10-CM | POA: Diagnosis not present

## 2023-09-30 DIAGNOSIS — K219 Gastro-esophageal reflux disease without esophagitis: Secondary | ICD-10-CM | POA: Diagnosis not present

## 2023-09-30 DIAGNOSIS — I10 Essential (primary) hypertension: Secondary | ICD-10-CM | POA: Diagnosis not present

## 2023-09-30 DIAGNOSIS — K449 Diaphragmatic hernia without obstruction or gangrene: Secondary | ICD-10-CM | POA: Diagnosis not present

## 2023-09-30 DIAGNOSIS — Z794 Long term (current) use of insulin: Secondary | ICD-10-CM | POA: Diagnosis not present

## 2023-09-30 DIAGNOSIS — E785 Hyperlipidemia, unspecified: Secondary | ICD-10-CM | POA: Diagnosis not present

## 2023-09-30 DIAGNOSIS — K589 Irritable bowel syndrome without diarrhea: Secondary | ICD-10-CM | POA: Diagnosis not present

## 2023-09-30 DIAGNOSIS — N3000 Acute cystitis without hematuria: Secondary | ICD-10-CM | POA: Diagnosis not present

## 2023-09-30 DIAGNOSIS — E1136 Type 2 diabetes mellitus with diabetic cataract: Secondary | ICD-10-CM | POA: Diagnosis not present

## 2023-10-01 DIAGNOSIS — Z794 Long term (current) use of insulin: Secondary | ICD-10-CM | POA: Diagnosis not present

## 2023-10-01 DIAGNOSIS — N3000 Acute cystitis without hematuria: Secondary | ICD-10-CM | POA: Diagnosis not present

## 2023-10-01 DIAGNOSIS — I10 Essential (primary) hypertension: Secondary | ICD-10-CM | POA: Diagnosis not present

## 2023-10-01 DIAGNOSIS — E1136 Type 2 diabetes mellitus with diabetic cataract: Secondary | ICD-10-CM | POA: Diagnosis not present

## 2023-10-02 DIAGNOSIS — N3000 Acute cystitis without hematuria: Secondary | ICD-10-CM | POA: Diagnosis not present

## 2023-10-02 DIAGNOSIS — E785 Hyperlipidemia, unspecified: Secondary | ICD-10-CM | POA: Diagnosis not present

## 2023-10-02 DIAGNOSIS — R300 Dysuria: Secondary | ICD-10-CM | POA: Diagnosis not present

## 2023-10-02 DIAGNOSIS — K219 Gastro-esophageal reflux disease without esophagitis: Secondary | ICD-10-CM | POA: Diagnosis not present

## 2023-10-02 DIAGNOSIS — K449 Diaphragmatic hernia without obstruction or gangrene: Secondary | ICD-10-CM | POA: Diagnosis not present

## 2023-10-02 DIAGNOSIS — E1136 Type 2 diabetes mellitus with diabetic cataract: Secondary | ICD-10-CM | POA: Diagnosis not present

## 2023-10-02 DIAGNOSIS — R2681 Unsteadiness on feet: Secondary | ICD-10-CM | POA: Diagnosis not present

## 2023-10-02 DIAGNOSIS — R110 Nausea: Secondary | ICD-10-CM | POA: Diagnosis not present

## 2023-10-02 DIAGNOSIS — I10 Essential (primary) hypertension: Secondary | ICD-10-CM | POA: Diagnosis not present

## 2023-10-02 DIAGNOSIS — Z794 Long term (current) use of insulin: Secondary | ICD-10-CM | POA: Diagnosis not present

## 2023-10-02 DIAGNOSIS — K589 Irritable bowel syndrome without diarrhea: Secondary | ICD-10-CM | POA: Diagnosis not present

## 2023-10-02 DIAGNOSIS — Z09 Encounter for follow-up examination after completed treatment for conditions other than malignant neoplasm: Secondary | ICD-10-CM | POA: Diagnosis not present

## 2023-10-02 DIAGNOSIS — M1990 Unspecified osteoarthritis, unspecified site: Secondary | ICD-10-CM | POA: Diagnosis not present

## 2023-10-03 ENCOUNTER — Encounter (HOSPITAL_COMMUNITY): Payer: Self-pay | Admitting: *Deleted

## 2023-10-03 ENCOUNTER — Emergency Department (HOSPITAL_COMMUNITY)

## 2023-10-03 ENCOUNTER — Other Ambulatory Visit: Payer: Self-pay

## 2023-10-03 ENCOUNTER — Inpatient Hospital Stay (HOSPITAL_COMMUNITY)
Admission: EM | Admit: 2023-10-03 | Discharge: 2023-10-06 | DRG: 641 | Disposition: A | Discharge status: Home Health | Attending: Internal Medicine | Admitting: Internal Medicine

## 2023-10-03 DIAGNOSIS — I10 Essential (primary) hypertension: Secondary | ICD-10-CM | POA: Diagnosis not present

## 2023-10-03 DIAGNOSIS — Z79899 Other long term (current) drug therapy: Secondary | ICD-10-CM | POA: Diagnosis not present

## 2023-10-03 DIAGNOSIS — E871 Hypo-osmolality and hyponatremia: Secondary | ICD-10-CM | POA: Diagnosis not present

## 2023-10-03 DIAGNOSIS — Z791 Long term (current) use of non-steroidal anti-inflammatories (NSAID): Secondary | ICD-10-CM | POA: Diagnosis not present

## 2023-10-03 DIAGNOSIS — D649 Anemia, unspecified: Secondary | ICD-10-CM | POA: Diagnosis not present

## 2023-10-03 DIAGNOSIS — E785 Hyperlipidemia, unspecified: Secondary | ICD-10-CM | POA: Diagnosis not present

## 2023-10-03 DIAGNOSIS — Z7984 Long term (current) use of oral hypoglycemic drugs: Secondary | ICD-10-CM

## 2023-10-03 DIAGNOSIS — R059 Cough, unspecified: Secondary | ICD-10-CM | POA: Diagnosis present

## 2023-10-03 DIAGNOSIS — Z888 Allergy status to other drugs, medicaments and biological substances status: Secondary | ICD-10-CM | POA: Diagnosis not present

## 2023-10-03 DIAGNOSIS — Z880 Allergy status to penicillin: Secondary | ICD-10-CM

## 2023-10-03 DIAGNOSIS — Z7982 Long term (current) use of aspirin: Secondary | ICD-10-CM | POA: Diagnosis not present

## 2023-10-03 DIAGNOSIS — Z8249 Family history of ischemic heart disease and other diseases of the circulatory system: Secondary | ICD-10-CM | POA: Diagnosis not present

## 2023-10-03 DIAGNOSIS — K219 Gastro-esophageal reflux disease without esophagitis: Secondary | ICD-10-CM | POA: Diagnosis present

## 2023-10-03 DIAGNOSIS — Z90710 Acquired absence of both cervix and uterus: Secondary | ICD-10-CM

## 2023-10-03 DIAGNOSIS — M1990 Unspecified osteoarthritis, unspecified site: Secondary | ICD-10-CM | POA: Diagnosis present

## 2023-10-03 DIAGNOSIS — N179 Acute kidney failure, unspecified: Secondary | ICD-10-CM | POA: Diagnosis not present

## 2023-10-03 DIAGNOSIS — Z9181 History of falling: Secondary | ICD-10-CM | POA: Diagnosis not present

## 2023-10-03 DIAGNOSIS — Z794 Long term (current) use of insulin: Secondary | ICD-10-CM

## 2023-10-03 DIAGNOSIS — I6529 Occlusion and stenosis of unspecified carotid artery: Secondary | ICD-10-CM | POA: Diagnosis not present

## 2023-10-03 DIAGNOSIS — Z66 Do not resuscitate: Secondary | ICD-10-CM | POA: Diagnosis present

## 2023-10-03 DIAGNOSIS — E869 Volume depletion, unspecified: Secondary | ICD-10-CM | POA: Diagnosis present

## 2023-10-03 DIAGNOSIS — R531 Weakness: Secondary | ICD-10-CM

## 2023-10-03 DIAGNOSIS — R569 Unspecified convulsions: Secondary | ICD-10-CM | POA: Diagnosis not present

## 2023-10-03 DIAGNOSIS — I6782 Cerebral ischemia: Secondary | ICD-10-CM | POA: Diagnosis not present

## 2023-10-03 DIAGNOSIS — Z833 Family history of diabetes mellitus: Secondary | ICD-10-CM

## 2023-10-03 DIAGNOSIS — E119 Type 2 diabetes mellitus without complications: Secondary | ICD-10-CM

## 2023-10-03 LAB — GLUCOSE, CAPILLARY
Glucose-Capillary: 200 mg/dL — ABNORMAL HIGH (ref 70–99)
Glucose-Capillary: 122 mg/dL — ABNORMAL HIGH (ref 70–99)

## 2023-10-03 LAB — COMPREHENSIVE METABOLIC PANEL WITH GFR
ALT: 22 U/L (ref 0–44)
AST: 25 U/L (ref 15–41)
Albumin: 3.3 g/dL — ABNORMAL LOW (ref 3.5–5.0)
Alkaline Phosphatase: 65 U/L (ref 38–126)
Anion gap: 14 (ref 5–15)
BUN: 17 mg/dL (ref 8–23)
CO2: 17 mmol/L — ABNORMAL LOW (ref 22–32)
Calcium: 8.8 mg/dL — ABNORMAL LOW (ref 8.9–10.3)
Chloride: 84 mmol/L — ABNORMAL LOW (ref 98–111)
Creatinine, Ser: 1.12 mg/dL — ABNORMAL HIGH (ref 0.44–1.00)
GFR, Estimated: 47 mL/min — ABNORMAL LOW (ref >60)
Glucose, Bld: 156 mg/dL — ABNORMAL HIGH (ref 70–99)
Potassium: 4.9 mmol/L (ref 3.5–5.1)
Sodium: 115 mmol/L — CL (ref 135–145)
Total Bilirubin: 0.9 mg/dL (ref 0.0–1.2)
Total Protein: 7.3 g/dL (ref 6.5–8.1)

## 2023-10-03 LAB — TSH: TSH: 2.731 u[IU]/mL (ref 0.350–4.500)

## 2023-10-03 LAB — CBC
HCT: 35.3 % — ABNORMAL LOW (ref 36.0–46.0)
Hemoglobin: 12.4 g/dL (ref 12.0–15.0)
MCH: 30.6 pg (ref 26.0–34.0)
MCHC: 35.1 g/dL (ref 30.0–36.0)
MCV: 87.2 fL (ref 80.0–100.0)
Platelets: 504 10*3/uL — ABNORMAL HIGH (ref 150–400)
RBC: 4.05 MIL/uL (ref 3.87–5.11)
RDW: 12.3 % (ref 11.5–15.5)
WBC: 10.1 10*3/uL (ref 4.0–10.5)
nRBC: 0 % (ref 0.0–0.2)

## 2023-10-03 LAB — CORTISOL: Cortisol, Plasma: 26.8 ug/dL

## 2023-10-03 LAB — OSMOLALITY
Osmolality: 248 mosm/kg — CL (ref 275–295)
Osmolality: 243 mosm/kg — CL (ref 275–295)

## 2023-10-03 LAB — MRSA NEXT GEN BY PCR, NASAL: MRSA by PCR Next Gen: NOT DETECTED

## 2023-10-03 LAB — SODIUM
Sodium: 115 mmol/L — CL (ref 135–145)
Sodium: 115 mmol/L — CL (ref 135–145)

## 2023-10-03 LAB — MAGNESIUM: Magnesium: 1.8 mg/dL (ref 1.7–2.4)

## 2023-10-03 MED ORDER — INSULIN ASPART 100 UNIT/ML IJ SOLN
0.0000 [IU] | INTRAMUSCULAR | Status: DC
  Administered 2023-10-03 – 2023-10-04 (×2): 2 [IU] via SUBCUTANEOUS
  Administered 2023-10-04: 1 [IU] via SUBCUTANEOUS
  Administered 2023-10-04: 3 [IU] via SUBCUTANEOUS
  Administered 2023-10-05: 1 [IU] via SUBCUTANEOUS
  Administered 2023-10-05 (×2): 2 [IU] via SUBCUTANEOUS
  Administered 2023-10-05: 3 [IU] via SUBCUTANEOUS
  Administered 2023-10-06: 1 [IU] via SUBCUTANEOUS
  Administered 2023-10-06: 3 [IU] via SUBCUTANEOUS

## 2023-10-03 MED ORDER — AMLODIPINE BESYLATE 5 MG PO TABS: 5.0000 mg | ORAL_TABLET | Freq: Every day | ORAL | Status: DC

## 2023-10-03 MED ORDER — LACTATED RINGERS IV SOLN: INTRAVENOUS | Status: DC

## 2023-10-03 MED ORDER — SODIUM CHLORIDE 3 % IV SOLN
INTRAVENOUS | Status: DC
  Filled 2023-10-03 (×4): qty 500

## 2023-10-03 MED ORDER — POLYETHYLENE GLYCOL 3350 17 G PO PACK: 17.0000 g | PACK | Freq: Every day | ORAL | Status: DC | PRN

## 2023-10-03 MED ORDER — SODIUM CHLORIDE 0.9 % IV SOLN: INTRAVENOUS | Status: DC

## 2023-10-03 MED ORDER — AMLODIPINE BESYLATE 10 MG PO TABS
10.0000 mg | ORAL_TABLET | Freq: Every day | ORAL | Status: DC
  Administered 2023-10-03 – 2023-10-04 (×2): 10 mg via ORAL
  Filled 2023-10-03 (×2): qty 1

## 2023-10-03 MED ORDER — DOCUSATE SODIUM 100 MG PO CAPS: 100.0000 mg | ORAL_CAPSULE | Freq: Two times a day (BID) | ORAL | Status: DC | PRN

## 2023-10-03 MED ORDER — HEPARIN SODIUM (PORCINE) 5000 UNIT/ML IJ SOLN
5000.0000 [IU] | Freq: Three times a day (TID) | INTRAMUSCULAR | Status: DC
  Administered 2023-10-03 – 2023-10-06 (×8): 5000 [IU] via SUBCUTANEOUS
  Filled 2023-10-03 (×8): qty 1

## 2023-10-03 MED ORDER — SODIUM CHLORIDE 3 % IV SOLN
INTRAVENOUS | Status: DC
  Filled 2023-10-03 (×2): qty 500

## 2023-10-03 MED ORDER — ASPIRIN 81 MG PO CHEW
81.0000 mg | CHEWABLE_TABLET | Freq: Every day | ORAL | Status: DC
  Administered 2023-10-04 – 2023-10-06 (×3): 81 mg via ORAL
  Filled 2023-10-03 (×3): qty 1

## 2023-10-03 MED ORDER — GUAIFENESIN 100 MG/5ML PO LIQD
5.0000 mL | ORAL | Status: DC | PRN
  Administered 2023-10-04: 5 mL via ORAL
  Filled 2023-10-03: qty 5

## 2023-10-03 NOTE — ED Provider Triage Note (Signed)
 Emergency Medicine Provider Triage Evaluation Note  ROQUEL BURGIN , a 89 y.o. female  was evaluated in triage.  Pt complains of hyponatremia, generalized weakness, fogginess, confusion.   Review of Systems  Positive: Mild confusion, generalized weakness, frequent fall Negative: HA, neck pain  Physical Exam  BP 98/68 (BP Location: Left Arm)   Pulse 88   Temp 98.3 F (36.8 C)   Resp 17   SpO2 94%  Gen:   Awake, no distress   Resp:  Normal effort  MSK:   Moves extremities without difficulty  Other:  No focal deficit.   Medical Decision Making  Medically screening exam initiated at 12:24 PM.  Appropriate orders placed.  Darya B Herard was informed that the remainder of the evaluation will be completed by another provider, this initial triage assessment does not replace that evaluation, and the importance of remaining in the ED until their evaluation is complete.  Patient has low sodium, sent by PCP. Na 117. No focal deficit. When fell did not hit head of LOC, no BT. CT ordered.    Eudora Heron, PA-C 10/03/23 1226

## 2023-10-03 NOTE — Progress Notes (Signed)
 Spoke with Primary RN about PICC, informed him that PICC team would not be able to place the PICC tonight. Recommended that if central access is needed to have MD place central line until PICC team can place PICC tomorrow.

## 2023-10-03 NOTE — ED Triage Notes (Signed)
 Pt arrives POV from home due hyponatremia on labs by PCP.  Pt sodium was 117 and she has had some confusion and fell 2 nights ago (denies any injuries, no LOC)

## 2023-10-03 NOTE — H&P (Addendum)
 NAME:  Dominique Simmons, MRN:  [id number], DOB:  12-02-1933, LOS: 0 ADMISSION DATE:  10/03/2023, CONSULTATION DATE: 10/03/23 REFERRING MD:  Elayne Snare CHIEF COMPLAINT:  Hyponatremia   History of Present Illness:  Dominique Simmons is a 89 y.o. female who has a PMH as below. She presented to [facility] ED 4/17 from her PCP office due to hyponatremia to 117. She had recent admission 09/15/23 through 09/18/23 for acute cystitis and was also found to have hyponatremia on that admission. It was recommended that she stop her hydrochlorothiazide however, it doesn't appear that she did.  She has had confusion for 2 days prior to ED presentation and had some stumbling and a fall or two but no injuries. She has had poor memory over this time frame as well along with increased generalized weakness and fatigue. She has also had decreased PO intake of solid foods but has been trying to increase her fluid intake of water and has maybe been drinking 2 - 3 bottles of water a day compared to her normal 1.  In ED, Na found to be 115. CT head and Cspine negative.  Due to her confusion and falls, she was started on hypetonic saline and PCCM was asked to admit to the ICU.  Pertinent  Medical History:  has Onychomycosis; Plantar fasciitis, right; Loss of weight; Dysphagia; Acute cystitis; Hyponatremia; Hypokalemia; Slurred speech; Generalized weakness; Essential hypertension; and Insulin dependent type 2 diabetes mellitus (HCC) on their problem list.  Significant Hospital Events: Including procedures, antibiotic start and stop dates in addition to other pertinent events   4/17 admit.  Interim History / Subjective:  Awake, alert, able to answer questions appropriately. Did have worse confusion over the last 2 days.  Objective:  Blood pressure (!) 137/57, pulse 72, temperature 97.6 F (36.4 C), temperature source Oral, resp. rate 18, SpO2 95%.       No intake or output data in the 24 hours ending 10/03/23 1640 There were no  vitals filed for this visit.  Examination: General: Elderly female, resting in bed, in NAD. Neuro: Awake, alert, able to answer questions. MAE's. HEENT: [place]/AT. Sclerae anicteric. MM dry. Cardiovascular: RRR, no M/R/G.  Lungs: Respirations even and unlabored.  CTA bilaterally, No W/R/R. Abdomen: BS x 4, soft, NT/ND.  Musculoskeletal: No gross deformities, no edema.  Skin: Intact, warm, no rashes.  Labs/imaging personally reviewed:  CT head and Cspine 417 > neg.  Assessment & Plan:   Symptomatic hyponatremia - due to decreased PO intake/volume depletion, increased free water intake, hydrochlorothiazide at baseline. - Started on 3% NS, continue at 20/hr since bag already spiked and with IVF shortage will avoid wastage. - Add NS at 50/hr. - Goal Na 125 by 1500 on 4/18. - Na q4hrs. - Will ask for PICC for frequent labs. - Seizure precautions. - Needs to stop hydrochlorothiazide and not take this in the future (pt and family instructed on this).  AKI - volume depletion + hydrochlorothiazide + Losartan + Mobic. - LR @ 75/hr. - Follow BMP.  Hx HTN. - Continue PTA Amlodipine, ASA. - Hold PTA Losartan. - D/c PTA hydrochlorothiazide and pt instructed to not take this in the future.  Hx DM. - SSI. - Hold PTA Metformin, Toujeo.   Best practice (evaluated daily):  Diet/type: Regular consistency (see orders) DVT prophylaxis: prophylactic heparin  Pressure ulcer(s): pressure ulcer assessment deferred  GI prophylaxis: N/A Lines: yes and it is still needed - PICC ordered Foley:  N/A Code Status:  DNR Last  date of multidisciplinary goals of care discussion: None yet.  Labs   CBC: Recent Labs  Lab 10/03/23 1218  WBC 10.1  HGB 12.4  HCT 35.3*  MCV 87.2  PLT 504*    Basic Metabolic Panel: Recent Labs  Lab 10/03/23 1218  NA 115*  K 4.9  CL 84*  CO2 17*  GLUCOSE 156*  BUN 17  CREATININE 1.12*  CALCIUM 8.8*   GFR: CrCl cannot be calculated (Unknown ideal  weight.). Recent Labs  Lab 10/03/23 1218  WBC 10.1    Liver Function Tests: Recent Labs  Lab 10/03/23 1218  AST 25  ALT 22  ALKPHOS 65  BILITOT 0.9  PROT 7.3  ALBUMIN 3.3*   No results for input(s): "LIPASE", "AMYLASE" in the last 168 hours. No results for input(s): "AMMONIA" in the last 168 hours.  ABG No results found for: "PHART", "PCO2ART", "PO2ART", "HCO3", "TCO2", "ACIDBASEDEF", "O2SAT"   Coagulation Profile: No results for input(s): "INR", "PROTIME" in the last 168 hours.  Cardiac Enzymes: No results for input(s): "CKTOTAL", "CKMB", "CKMBINDEX", "TROPONINI" in the last 168 hours.  HbA1C: Hgb A1c MFr Bld  Date/Time Value Ref Range Status  09/16/2023 09:19 AM 6.7 (H) 4.8 - 5.6 % Final    Comment:    (NOTE) Pre diabetes:          5.7%-6.4%  Diabetes:              >6.4%  Glycemic control for   <7.0% adults with diabetes     CBG: No results for input(s): "GLUCAP" in the last 168 hours.  Review of Systems:   All negative; except for those that are bolded, which indicate positives.  Constitutional: weight loss, weight gain, night sweats, fevers, chills, fatigue, weakness.  HEENT: headaches, sore throat, sneezing, nasal congestion, post nasal drip, difficulty swallowing, tooth/dental problems, visual complaints, visual changes, ear aches. Neuro: difficulty with speech, weakness, numbness, ataxia, confusion, fall. CV:  chest pain, orthopnea, PND, swelling in lower extremities, dizziness, palpitations, syncope.  Resp: cough, hemoptysis, dyspnea, wheezing. GI: heartburn, indigestion, abdominal pain, nausea, vomiting, diarrhea, constipation, change in bowel habits, loss of appetite, hematemesis, melena, hematochezia.  GU: dysuria, change in color of urine, urgency or frequency, flank pain, hematuria. MSK: joint pain or swelling, decreased range of motion. Psych: change in mood or affect, depression, anxiety, suicidal ideations, homicidal ideations. Skin: rash,  itching, bruising.   Past Medical History:  She,  has a past medical history of Arthritis, Cataract, Diabetes mellitus without complication (HCC), Esophageal ring (11/2011), GERD (gastroesophageal reflux disease), Hyperlipidemia, Hypertension, IBS (irritable bowel syndrome), and Sliding hiatal hernia (11/2011).   Surgical History:   Past Surgical History:  Procedure Laterality Date   COLONOSCOPY     KNEE ARTHROSCOPY  11/2006   right   ROTATOR CUFF REPAIR     left   TONSILLECTOMY     TOTAL ABDOMINAL HYSTERECTOMY  1980   UPPER GASTROINTESTINAL ENDOSCOPY     VEIN LIGATION AND STRIPPING  2003   bilateral   WRIST SURGERY     left     Social History:   reports that she has never smoked. She has never used smokeless tobacco. She reports that she does not drink alcohol and does not use drugs.   Family History:  Her family history includes Coronary artery disease in her father, mother, and sister; Diabetes in her brother; Heart attack in her sister; Heart disease in her father and mother. There is no history of Colon cancer, Esophageal cancer,  Rectal cancer, or Stomach cancer.   Allergies Allergies  Allergen Reactions   Amoxicillin     REACTION: rash   Lescol [Fluvastatin Sodium]     Myalgia   Lipitor [Atorvastatin]     Myalgia   Lisinopril     Cough   Pravachol [Pravastatin Sodium]     Myalgia     Home Medications  Prior to Admission medications   Medication Sig Start Date End Date Taking? Authorizing Provider  acetaminophen (TYLENOL) 325 MG tablet Take 650 mg by mouth every 6 (six) hours as needed for mild pain (pain score 1-3).   Yes [provider]  amLODipine (NORVASC) 5 MG tablet Take 5 mg by mouth daily.   Yes [provider]  aspirin 81 MG tablet Take 81 mg by mouth daily.   Yes [provider]  clotrimazole-betamethasone (LOTRISONE) cream Apply 1 Application topically daily as needed (for rash). 01/31/12  Yes [provider]   losartan (COZAAR) 25 MG tablet Take 25 mg by mouth daily.   Yes [provider]  meloxicam (MOBIC) 15 MG tablet Take 15 mg by mouth daily. 09/03/23  Yes [provider]  metFORMIN (GLUCOPHAGE) 500 MG tablet Take 2 tablets by mouth every evening.   Yes [provider]  ondansetron (ZOFRAN) 4 MG tablet Take 4 mg by mouth as needed for vomiting or nausea. 10/02/23  Yes [provider]  sulfamethoxazole-trimethoprim (BACTRIM DS) 800-160 MG tablet Take 1 tablet by mouth 2 (two) times daily. 09/27/23  Yes [provider]  TOUJEO SOLOSTAR 300 UNIT/ML Solostar Pen Inject 12 Units into the skin daily.   Yes [provider]  COD LIVER OIL PO Take 1 tablet by mouth daily at 6 (six) AM. Patient not taking: Reported on 10/03/2023    [provider]  hydrochlorothiazide (HYDRODIURIL) 25 MG tablet Take 25 mg by mouth daily. Patient not taking: Reported on 10/03/2023    [provider]     Critical care time: 35 min.   Rafael Bun, PA - [facility] For pager details, please see AMION or use Epic chat  After 1900, please call [facility] for cross coverage needs 10/03/2023, 4:40 PM

## 2023-10-03 NOTE — ED Provider Notes (Signed)
 Chart reviewed and patient presents with cc of hyponatremia and weakness.  Patient at risk of fall and seizure.  Labs show collected but not in process.  Attempting to query status at this time. Nursing advised patient needs bed and monitor.    Auston Blush, MD 10/03/23 1340

## 2023-10-03 NOTE — Progress Notes (Addendum)
 eLink Physician-Brief Progress Note Patient Name: LANESHA AZZARO DOB: June 17, 1934 MRN: [id number]   Date of Service  10/03/2023  HPI/Events of Note  89 year old female that presented with symptomatic hyponatremia in the setting of AKI.  Having cough  eICU Interventions  Add guaifenesin    0006 - Na 115, has been for the last 3 checks.  Currently on 3% @ 20 ml/hr, NS @ 50 ml/hr.  Goal Na 125 by 3p tomorrow; increased 3%, reduce NS  Intervention Category Minor Interventions: Routine modifications to care plan (e.g. PRN medications for pain, fever)  Emani Morad 10/03/2023, 11:32 PM

## 2023-10-03 NOTE — ED Notes (Signed)
 Call to lab to follow up on delay

## 2023-10-03 NOTE — ED Notes (Signed)
 Critical lab value 115 NA, savanah PA aware.

## 2023-10-03 NOTE — ED Provider Notes (Signed)
 [place] [facility] Provider Note   CSN: [id number] Arrival date & time: 10/03/23  1152     History  Chief Complaint  Patient presents with   Abnormal Lab    Hyponatremia 117    Kallan B Stofer is a 89 y.o. female.  With a history of hyponatremia, hypokalemia, type 2 diabetes and hypertension who presents to ED for generalized weakness.  Patient had outpatient labs drawn by her PCP office which showed hyponatremia of 117 today.  She notes recently increased generalized weakness and global confusion.  Fall 2 nights ago without significant injury.  No seizures or obtundation.  Son at bedside voices concern for decreased p.o. intake over the last few weeks.  Patient states she has no appetite.  No fevers chills chest pain shortness of breath or other GI symptoms.  Recent hospitalization earlier this month for UTI with altered mental status   Abnormal Lab      Home Medications Prior to Admission medications   Medication Sig Start Date End Date Taking? Authorizing Provider  acetaminophen (TYLENOL) 325 MG tablet Take 650 mg by mouth every 6 (six) hours as needed for mild pain (pain score 1-3).    [provider]  amLODipine (NORVASC) 5 MG tablet Take 5 mg by mouth daily.    [provider]  aspirin 81 MG tablet Take 81 mg by mouth daily.    [provider]  clotrimazole-betamethasone (LOTRISONE) cream Apply 1 Application topically daily as needed (for rash). 01/31/12   [provider]  COD LIVER OIL PO Take 1 tablet by mouth daily at 6 (six) AM.    [provider]  hydrochlorothiazide (HYDRODIURIL) 25 MG tablet Take 25 mg by mouth daily.    [provider]  losartan (COZAAR) 25 MG tablet Take 25 mg by mouth daily.    [provider]  meloxicam (MOBIC) 15 MG tablet Take 15 mg by mouth daily. 09/03/23   [provider]  metFORMIN (GLUCOPHAGE) 500 MG tablet Take 2 tablets by mouth  every evening.    [provider]  TOUJEO SOLOSTAR 300 UNIT/ML Solostar Pen Inject 12 Units into the skin daily.    [provider]      Allergies    Amoxicillin, Lescol [fluvastatin sodium], Lipitor [atorvastatin], Lisinopril, and Pravachol [pravastatin sodium]    Review of Systems   Review of Systems  Physical Exam Updated Vital Signs BP (!) 137/57 (BP Location: Right Arm)   Pulse 72   Temp 97.6 F (36.4 C) (Oral)   Resp 18   SpO2 95%  Physical Exam Vitals and nursing note reviewed.  HENT:     Head: Normocephalic and atraumatic.  Eyes:     Pupils: Pupils are equal, round, and reactive to light.  Cardiovascular:     Rate and Rhythm: Normal rate and regular rhythm.  Pulmonary:     Effort: Pulmonary effort is normal.     Breath sounds: Normal breath sounds.  Abdominal:     Palpations: Abdomen is soft.     Tenderness: There is no abdominal tenderness.  Skin:    General: Skin is warm and dry.  Neurological:     General: No focal deficit present.     Mental Status: She is alert and oriented to person, place, and time.     Sensory: No sensory deficit.     Motor: No weakness.  Psychiatric:        Mood and Affect: Mood normal.  ED Results / Procedures / Treatments   Labs (all labs ordered are listed, but only abnormal results are displayed) Labs Reviewed  CBC - Abnormal; Notable for the following components:      Result Value   HCT 35.3 (*)    Platelets 504 (*)    All other components within normal limits  COMPREHENSIVE METABOLIC PANEL WITH GFR - Abnormal; Notable for the following components:   Sodium 115 (*)    Chloride 84 (*)    CO2 17 (*)    Glucose, Bld 156 (*)    Creatinine, Ser 1.12 (*)    Calcium 8.8 (*)    Albumin 3.3 (*)    GFR, Estimated 47 (*)    All other components within normal limits  OSMOLALITY - Abnormal; Notable for the following components:   Osmolality 248 (*)    All other components within normal limits  URINALYSIS,  ROUTINE W REFLEX MICROSCOPIC  OSMOLALITY, URINE  MAGNESIUM  SODIUM  SODIUM  SODIUM  SODIUM  SODIUM, URINE, RANDOM    EKG None  Radiology CT Head Wo Contrast Result Date: 10/03/2023 CLINICAL DATA:  Trauma, hyponatremia, falls and seizures. EXAM: CT HEAD WITHOUT CONTRAST CT CERVICAL SPINE WITHOUT CONTRAST TECHNIQUE: Multidetector CT imaging of the head and cervical spine was performed following the standard protocol without intravenous contrast. Multiplanar CT image reconstructions of the cervical spine were also generated. RADIATION DOSE REDUCTION: This exam was performed according to the departmental dose-optimization program which includes automated exposure control, adjustment of the mA and/or kV according to patient size and/or use of iterative reconstruction technique. COMPARISON:  MRI head 09/16/2023, MRI cervical spine 03/16/2009. FINDINGS: CT HEAD FINDINGS Brain: No acute intracranial hemorrhage. No CT evidence of acute infarct. Nonspecific hypoattenuation in the periventricular and subcortical white matter favored to reflect chronic microvascular ischemic changes. No edema, mass effect, or midline shift. The basilar cisterns are patent. Ventricles: The ventricles are normal. Vascular: Atherosclerotic calcifications of the carotid siphons. No hyperdense vessel. Skull: No acute or aggressive finding. Orbits: Orbits are symmetric. Sinuses: The visualized paranasal sinuses are clear. Other: Mastoid air cells are clear. CT CERVICAL SPINE FINDINGS Alignment: Straightening of the normal cervical lordosis. No listhesis. No facet subluxation or dislocation. Skull base and vertebrae: No compression fracture or displaced fracture in the cervical spine. No suspicious osseous lesion. Soft tissues and spinal canal: No prevertebral fluid or swelling. No visible canal hematoma. Disc levels: Severe disc space narrowing at C5-6. Additional mild disc space narrowing at multiple levels. Small disc bulges at  multiple levels. Disc osteophyte complex at C5-6 resulting in mild spinal canal stenosis. Facet arthrosis and uncovertebral hypertrophy throughout the cervical spine. Foraminal narrowing is most pronounced at C3-4 and C5-6. Upper chest: Biapical pleuroparenchymal scarring. Other: None. IMPRESSION: No CT evidence of acute intracranial abnormality. No acute fracture or traumatic malalignment of the cervical spine. Degenerative changes of the cervical spine as above. Mild chronic microvascular ischemic changes. Electronically Signed   By: Denny Flack M.D.   On: 10/03/2023 14:51   CT Cervical Spine Wo Contrast Result Date: 10/03/2023 CLINICAL DATA:  Trauma, hyponatremia, falls and seizures. EXAM: CT HEAD WITHOUT CONTRAST CT CERVICAL SPINE WITHOUT CONTRAST TECHNIQUE: Multidetector CT imaging of the head and cervical spine was performed following the standard protocol without intravenous contrast. Multiplanar CT image reconstructions of the cervical spine were also generated. RADIATION DOSE REDUCTION: This exam was performed according to the departmental dose-optimization program which includes automated exposure control, adjustment of the mA and/or kV according  to patient size and/or use of iterative reconstruction technique. COMPARISON:  MRI head 09/16/2023, MRI cervical spine 03/16/2009. FINDINGS: CT HEAD FINDINGS Brain: No acute intracranial hemorrhage. No CT evidence of acute infarct. Nonspecific hypoattenuation in the periventricular and subcortical white matter favored to reflect chronic microvascular ischemic changes. No edema, mass effect, or midline shift. The basilar cisterns are patent. Ventricles: The ventricles are normal. Vascular: Atherosclerotic calcifications of the carotid siphons. No hyperdense vessel. Skull: No acute or aggressive finding. Orbits: Orbits are symmetric. Sinuses: The visualized paranasal sinuses are clear. Other: Mastoid air cells are clear. CT CERVICAL SPINE FINDINGS Alignment:  Straightening of the normal cervical lordosis. No listhesis. No facet subluxation or dislocation. Skull base and vertebrae: No compression fracture or displaced fracture in the cervical spine. No suspicious osseous lesion. Soft tissues and spinal canal: No prevertebral fluid or swelling. No visible canal hematoma. Disc levels: Severe disc space narrowing at C5-6. Additional mild disc space narrowing at multiple levels. Small disc bulges at multiple levels. Disc osteophyte complex at C5-6 resulting in mild spinal canal stenosis. Facet arthrosis and uncovertebral hypertrophy throughout the cervical spine. Foraminal narrowing is most pronounced at C3-4 and C5-6. Upper chest: Biapical pleuroparenchymal scarring. Other: None. IMPRESSION: No CT evidence of acute intracranial abnormality. No acute fracture or traumatic malalignment of the cervical spine. Degenerative changes of the cervical spine as above. Mild chronic microvascular ischemic changes. Electronically Signed   By: Denny Flack M.D.   On: 10/03/2023 14:51    Procedures Procedures    Medications Ordered in ED Medications  sodium chloride (hypertonic) 3 % solution (has no administration in time range)    ED Course/ Medical Decision Making/ A&P Clinical Course as of 10/03/23 1607  Thu Oct 03, 2023  1607 Discussed with ICU provider who will evaluate patient in ED and admit to ICU [MP]    Clinical Course User Index [MP] Sallyanne Creamer, DO                                 Medical Decision Making 89 year old female with history as above presenting to the ED given concern for critical hyponatremia of 117 as drawn on outpatient labs from PCP office.  Repeat sodium here does indeed show critical sodium of 113.  No coma, seizure activity or significant obtundation.  Global confusion with generalized weakness.  At this time no indication for hypertonic saline bolus but will initiate IV hypertonic saline and obtain laboratory workup.  Will admit to  medicine for frequent neurochecks and electrolyte correction.  CT head C-spine taken given reported fall 2 days ago to evaluate for traumatic injury but no evidence of acute trauma.  Amount and/or Complexity of Data Reviewed Labs: ordered.  Risk Prescription drug management. Decision regarding hospitalization.           Final Clinical Impression(s) / ED Diagnoses Final diagnoses:  Hyponatremia  Generalized weakness    Rx / DC Orders ED Discharge Orders     None         Sallyanne Creamer, DO 10/03/23 1607

## 2023-10-04 DIAGNOSIS — E871 Hypo-osmolality and hyponatremia: Secondary | ICD-10-CM

## 2023-10-04 LAB — BASIC METABOLIC PANEL WITH GFR
Anion gap: 13 (ref 5–15)
Anion gap: 10 (ref 5–15)
Anion gap: 11 (ref 5–15)
Anion gap: 10 (ref 5–15)
BUN: 15 mg/dL (ref 8–23)
BUN: 12 mg/dL (ref 8–23)
BUN: 14 mg/dL (ref 8–23)
BUN: 15 mg/dL (ref 8–23)
CO2: 21 mmol/L — ABNORMAL LOW (ref 22–32)
CO2: 21 mmol/L — ABNORMAL LOW (ref 22–32)
CO2: 21 mmol/L — ABNORMAL LOW (ref 22–32)
CO2: 20 mmol/L — ABNORMAL LOW (ref 22–32)
Calcium: 8.8 mg/dL — ABNORMAL LOW (ref 8.9–10.3)
Calcium: 8.5 mg/dL — ABNORMAL LOW (ref 8.9–10.3)
Calcium: 8.4 mg/dL — ABNORMAL LOW (ref 8.9–10.3)
Calcium: 8.6 mg/dL — ABNORMAL LOW (ref 8.9–10.3)
Chloride: 87 mmol/L — ABNORMAL LOW (ref 98–111)
Chloride: 96 mmol/L — ABNORMAL LOW (ref 98–111)
Chloride: 93 mmol/L — ABNORMAL LOW (ref 98–111)
Chloride: 96 mmol/L — ABNORMAL LOW (ref 98–111)
Creatinine, Ser: 0.87 mg/dL (ref 0.44–1.00)
Creatinine, Ser: 0.91 mg/dL (ref 0.44–1.00)
Creatinine, Ser: 0.99 mg/dL (ref 0.44–1.00)
Creatinine, Ser: 1.02 mg/dL — ABNORMAL HIGH (ref 0.44–1.00)
GFR, Estimated: >60 mL/min (ref >60)
GFR, Estimated: >60 mL/min (ref >60)
GFR, Estimated: 55 mL/min — ABNORMAL LOW (ref >60)
GFR, Estimated: 53 mL/min — ABNORMAL LOW (ref >60)
Glucose, Bld: 136 mg/dL — ABNORMAL HIGH (ref 70–99)
Glucose, Bld: 230 mg/dL — ABNORMAL HIGH (ref 70–99)
Glucose, Bld: 178 mg/dL — ABNORMAL HIGH (ref 70–99)
Glucose, Bld: 81 mg/dL (ref 70–99)
Potassium: 4.1 mmol/L (ref 3.5–5.1)
Potassium: 3.9 mmol/L (ref 3.5–5.1)
Potassium: 3.8 mmol/L (ref 3.5–5.1)
Potassium: 4.4 mmol/L (ref 3.5–5.1)
Sodium: 121 mmol/L — ABNORMAL LOW (ref 135–145)
Sodium: 127 mmol/L — ABNORMAL LOW (ref 135–145)
Sodium: 125 mmol/L — ABNORMAL LOW (ref 135–145)
Sodium: 126 mmol/L — ABNORMAL LOW (ref 135–145)

## 2023-10-04 LAB — CBC
HCT: 31.9 % — ABNORMAL LOW (ref 36.0–46.0)
Hemoglobin: 11.6 g/dL — ABNORMAL LOW (ref 12.0–15.0)
MCH: 31 pg (ref 26.0–34.0)
MCHC: 36.4 g/dL — ABNORMAL HIGH (ref 30.0–36.0)
MCV: 85.3 fL (ref 80.0–100.0)
Platelets: 368 10*3/uL (ref 150–400)
RBC: 3.74 MIL/uL — ABNORMAL LOW (ref 3.87–5.11)
RDW: 12.3 % (ref 11.5–15.5)
WBC: 6.9 10*3/uL (ref 4.0–10.5)
nRBC: 0 % (ref 0.0–0.2)

## 2023-10-04 LAB — GLUCOSE, CAPILLARY
Glucose-Capillary: 125 mg/dL — ABNORMAL HIGH (ref 70–99)
Glucose-Capillary: 142 mg/dL — ABNORMAL HIGH (ref 70–99)
Glucose-Capillary: 86 mg/dL (ref 70–99)
Glucose-Capillary: 117 mg/dL — ABNORMAL HIGH (ref 70–99)
Glucose-Capillary: 193 mg/dL — ABNORMAL HIGH (ref 70–99)
Glucose-Capillary: 90 mg/dL (ref 70–99)
Glucose-Capillary: 225 mg/dL — ABNORMAL HIGH (ref 70–99)

## 2023-10-04 LAB — OSMOLALITY, URINE: Osmolality, Ur: 265 mosm/kg — ABNORMAL LOW (ref 300–900)

## 2023-10-04 LAB — MAGNESIUM: Magnesium: 1.9 mg/dL (ref 1.7–2.4)

## 2023-10-04 LAB — URINALYSIS, ROUTINE W REFLEX MICROSCOPIC
Bilirubin Urine: NEGATIVE
Glucose, UA: NEGATIVE mg/dL
Hgb urine dipstick: NEGATIVE
Ketones, ur: NEGATIVE mg/dL
Leukocytes,Ua: NEGATIVE
Nitrite: NEGATIVE
Protein, ur: NEGATIVE mg/dL
Specific Gravity, Urine: 1.006 (ref 1.005–1.030)
pH: 7 (ref 5.0–8.0)

## 2023-10-04 LAB — SODIUM, URINE, RANDOM: Sodium, Ur: 46 mmol/L

## 2023-10-04 LAB — PHOSPHORUS: Phosphorus: 3.8 mg/dL (ref 2.5–4.6)

## 2023-10-04 LAB — SODIUM: Sodium: 125 mmol/L — ABNORMAL LOW (ref 135–145)

## 2023-10-04 MED ORDER — SODIUM CHLORIDE 0.9 % IV SOLN: INTRAVENOUS | Status: DC

## 2023-10-04 MED ORDER — CHLORHEXIDINE GLUCONATE CLOTH 2 % EX PADS
6.0000 | MEDICATED_PAD | Freq: Every day | CUTANEOUS | Status: DC
  Administered 2023-10-04 – 2023-10-06 (×3): 6 via TOPICAL

## 2023-10-04 MED ORDER — DEXTROSE 5 % IV SOLN: INTRAVENOUS | Status: DC

## 2023-10-04 NOTE — Progress Notes (Signed)
 1800 Dr Devra Fontana to HOLD D5W if 2030 BMP results sodium 125 or less.

## 2023-10-04 NOTE — Progress Notes (Signed)
 PROGRESS NOTE    Dominique Simmons  WUX:[id number] DOB: 1933-09-20 DOA: 10/03/2023 PCP: Virgle Grime, MD  Outpatient Specialists:     Brief Narrative:  Patient is an 89 year old female with past medical history significant for hyponatremia, diabetes mellitus with complications, GERD, hyperlipidemia, hypertension and irritable bowel syndrome.  Patient was admitted with symptomatic hyponatremia, sodium of 115.  Apparently, patient has been on HCTZ.  Patient was initially managed with 3% saline.  10/04/2023: Sodium has improved to 125.  Will discontinue 3% saline.  Continue to monitor BMP.  Goal is for 6 to 8 mEq rise in sodium within first 24 hours.  Patient seen alongside patient's husband and son.  Patient feels much improved.  Will continue to manage hyponatremia.  Will avoid excessive rise in sodium level.     Assessment & Plan:   Principal Problem:   Hyponatremia   Symptomatic anemia: - Discontinue 3% saline. - Patient was on NSAIDs and HCTZ. - Last sodium level was 125. - Monitor BMP closely.  Low threshold to consider D5 water i.e. if excessive rise in sodium is noted.  Hypertension: - Continue to optimize. - Monitor closely. - Discontinue HCTZ.  History of diabetes mellitus: - Sliding scale insulin  coverage.   DVT prophylaxis: Subcutaneous heparin . Code Status: DO NOT RESUSCITATE. Family Communication: Husband and son. Disposition Plan: Patient remains inpatient.   Consultants:  None.  Procedures:  None.  Antimicrobials:  None.   Subjective: Fatigue has improved.  Objective: Vitals:   10/04/23 1300 10/04/23 1400 10/04/23 1500 10/04/23 1601  BP: (!) 137/59 127/69 124/63   Pulse: 91 90 94   Resp: 19 18 11    Temp:    97.8 F (36.6 C)  TempSrc:    Oral  SpO2: 99% 98% 100%   Weight:        Intake/Output Summary (Last 24 hours) at 10/04/2023 1652 Last data filed at 10/04/2023 1500 Gross per 24 hour  Intake 1329.03 ml  Output 350 ml  Net  979.03 ml   Filed Weights   10/04/23 0500  Weight: 65 kg    Examination:  General exam: Appears calm and comfortable  Respiratory system: Clear to auscultation.  Cardiovascular system: S1 & S2 heard, RRR.  Gastrointestinal system: Abdomen is nondistended, soft and nontender.  Central nervous system: Alert and oriented. . Extremities: No leg edema.  Data Reviewed: I have personally reviewed following labs and imaging studies  CBC: Recent Labs  Lab 10/03/23 1218 10/04/23 0339  WBC 10.1 6.9  HGB 12.4 11.6*  HCT 35.3* 31.9*  MCV 87.2 85.3  PLT 504* 368   Basic Metabolic Panel: Recent Labs  Lab 10/03/23 1218 10/03/23 2000 10/03/23 2257 10/04/23 0339 10/04/23 1021 10/04/23 1418 10/04/23 1611  NA 115*   < > 115* 121* 125* 127* 125*  K 4.9  --   --  4.1  --  3.9 3.8  CL 84*  --   --  87*  --  96* 93*  CO2 17*  --   --  21*  --  21* 21*  GLUCOSE 156*  --   --  136*  --  230* 178*  BUN 17  --   --  15  --  12 14  CREATININE 1.12*  --   --  0.87  --  0.91 0.99  CALCIUM 8.8*  --   --  8.8*  --  8.5* 8.4*  MG 1.8  --   --  1.9  --   --   --  PHOS  --   --   --  3.8  --   --   --    < > = values in this interval not displayed.   GFR: Estimated Creatinine Clearance: 37.5 mL/min (by C-G formula based on SCr of 0.99 mg/dL). Liver Function Tests: Recent Labs  Lab 10/03/23 1218  AST 25  ALT 22  ALKPHOS 65  BILITOT 0.9  PROT 7.3  ALBUMIN 3.3*   No results for input(s): "LIPASE", "AMYLASE" in the last 168 hours. No results for input(s): "AMMONIA" in the last 168 hours. Coagulation Profile: No results for input(s): "INR", "PROTIME" in the last 168 hours. Cardiac Enzymes: No results for input(s): "CKTOTAL", "CKMB", "CKMBINDEX", "TROPONINI" in the last 168 hours. BNP (last 3 results) No results for input(s): "PROBNP" in the last 8760 hours. HbA1C: No results for input(s): "HGBA1C" in the last 72 hours. CBG: Recent Labs  Lab 10/03/23 2309 10/04/23 0324  10/04/23 0832 10/04/23 1158 10/04/23 1600  GLUCAP 122* 142* 86 117* 193*   Lipid Profile: No results for input(s): "CHOL", "HDL", "LDLCALC", "TRIG", "CHOLHDL", "LDLDIRECT" in the last 72 hours. Thyroid  Function Tests: Recent Labs    10/03/23 2000  TSH 2.731   Anemia Panel: No results for input(s): "VITAMINB12", "FOLATE", "FERRITIN", "TIBC", "IRON", "RETICCTPCT" in the last 72 hours. Urine analysis:    Component Value Date/Time   COLORURINE YELLOW 10/03/2023 2300   APPEARANCEUR CLEAR 10/03/2023 2300   LABSPEC 1.006 10/03/2023 2300   PHURINE 7.0 10/03/2023 2300   GLUCOSEU NEGATIVE 10/03/2023 2300   HGBUR NEGATIVE 10/03/2023 2300   BILIRUBINUR NEGATIVE 10/03/2023 2300   KETONESUR NEGATIVE 10/03/2023 2300   PROTEINUR NEGATIVE 10/03/2023 2300   UROBILINOGEN 0.2 11/27/2006 0815   NITRITE NEGATIVE 10/03/2023 2300   LEUKOCYTESUR NEGATIVE 10/03/2023 2300   Sepsis Labs: @LABRCNTIP (procalcitonin:4,lacticidven:4)  ) Recent Results (from the past 240 hours)  MRSA Next Gen by PCR, Nasal     Status: None   Collection Time: 10/03/23  5:39 PM   Specimen: Nasal Mucosa; Nasal Swab  Result Value Ref Range Status   MRSA by PCR Next Gen NOT DETECTED NOT DETECTED Final    Comment: (NOTE) The GeneXpert MRSA Assay (FDA approved for NASAL specimens only), is one component of a comprehensive MRSA colonization surveillance program. It is not intended to diagnose MRSA infection nor to guide or monitor treatment for MRSA infections. Test performance is not FDA approved in patients less than 69 years old. Performed at [facility], [address], [place], [place] [address]          Radiology Studies: US  EKG SITE RITE Result Date: 10/03/2023 If Site Rite image not attached, placement could not be confirmed due to current cardiac rhythm.  CT Head Wo Contrast Result Date: 10/03/2023 CLINICAL DATA:  Trauma, hyponatremia, falls and seizures. EXAM: CT HEAD WITHOUT CONTRAST CT CERVICAL  SPINE WITHOUT CONTRAST TECHNIQUE: Multidetector CT imaging of the head and cervical spine was performed following the standard protocol without intravenous contrast. Multiplanar CT image reconstructions of the cervical spine were also generated. RADIATION DOSE REDUCTION: This exam was performed according to the departmental dose-optimization program which includes automated exposure control, adjustment of the mA and/or kV according to patient size and/or use of iterative reconstruction technique. COMPARISON:  MRI head 09/16/2023, MRI cervical spine 03/16/2009. FINDINGS: CT HEAD FINDINGS Brain: No acute intracranial hemorrhage. No CT evidence of acute infarct. Nonspecific hypoattenuation in the periventricular and subcortical white matter favored to reflect chronic microvascular ischemic changes. No edema,  mass effect, or midline shift. The basilar cisterns are patent. Ventricles: The ventricles are normal. Vascular: Atherosclerotic calcifications of the carotid siphons. No hyperdense vessel. Skull: No acute or aggressive finding. Orbits: Orbits are symmetric. Sinuses: The visualized paranasal sinuses are clear. Other: Mastoid air cells are clear. CT CERVICAL SPINE FINDINGS Alignment: Straightening of the normal cervical lordosis. No listhesis. No facet subluxation or dislocation. Skull base and vertebrae: No compression fracture or displaced fracture in the cervical spine. No suspicious osseous lesion. Soft tissues and spinal canal: No prevertebral fluid or swelling. No visible canal hematoma. Disc levels: Severe disc space narrowing at C5-6. Additional mild disc space narrowing at multiple levels. Small disc bulges at multiple levels. Disc osteophyte complex at C5-6 resulting in mild spinal canal stenosis. Facet arthrosis and uncovertebral hypertrophy throughout the cervical spine. Foraminal narrowing is most pronounced at C3-4 and C5-6. Upper chest: Biapical pleuroparenchymal scarring. Other: None. IMPRESSION: No  CT evidence of acute intracranial abnormality. No acute fracture or traumatic malalignment of the cervical spine. Degenerative changes of the cervical spine as above. Mild chronic microvascular ischemic changes. Electronically Signed   By: Denny Flack M.D.   On: 10/03/2023 14:51   CT Cervical Spine Wo Contrast Result Date: 10/03/2023 CLINICAL DATA:  Trauma, hyponatremia, falls and seizures. EXAM: CT HEAD WITHOUT CONTRAST CT CERVICAL SPINE WITHOUT CONTRAST TECHNIQUE: Multidetector CT imaging of the head and cervical spine was performed following the standard protocol without intravenous contrast. Multiplanar CT image reconstructions of the cervical spine were also generated. RADIATION DOSE REDUCTION: This exam was performed according to the departmental dose-optimization program which includes automated exposure control, adjustment of the mA and/or kV according to patient size and/or use of iterative reconstruction technique. COMPARISON:  MRI head 09/16/2023, MRI cervical spine 03/16/2009. FINDINGS: CT HEAD FINDINGS Brain: No acute intracranial hemorrhage. No CT evidence of acute infarct. Nonspecific hypoattenuation in the periventricular and subcortical white matter favored to reflect chronic microvascular ischemic changes. No edema, mass effect, or midline shift. The basilar cisterns are patent. Ventricles: The ventricles are normal. Vascular: Atherosclerotic calcifications of the carotid siphons. No hyperdense vessel. Skull: No acute or aggressive finding. Orbits: Orbits are symmetric. Sinuses: The visualized paranasal sinuses are clear. Other: Mastoid air cells are clear. CT CERVICAL SPINE FINDINGS Alignment: Straightening of the normal cervical lordosis. No listhesis. No facet subluxation or dislocation. Skull base and vertebrae: No compression fracture or displaced fracture in the cervical spine. No suspicious osseous lesion. Soft tissues and spinal canal: No prevertebral fluid or swelling. No visible  canal hematoma. Disc levels: Severe disc space narrowing at C5-6. Additional mild disc space narrowing at multiple levels. Small disc bulges at multiple levels. Disc osteophyte complex at C5-6 resulting in mild spinal canal stenosis. Facet arthrosis and uncovertebral hypertrophy throughout the cervical spine. Foraminal narrowing is most pronounced at C3-4 and C5-6. Upper chest: Biapical pleuroparenchymal scarring. Other: None. IMPRESSION: No CT evidence of acute intracranial abnormality. No acute fracture or traumatic malalignment of the cervical spine. Degenerative changes of the cervical spine as above. Mild chronic microvascular ischemic changes. Electronically Signed   By: Denny Flack M.D.   On: 10/03/2023 14:51        Scheduled Meds:  amLODipine   10 mg Oral Daily   aspirin   81 mg Oral Daily   Chlorhexidine  Gluconate Cloth  6 each Topical Daily   heparin   5,000 Units Subcutaneous Q8H   insulin  aspart  0-9 Units Subcutaneous Q4H   Continuous Infusions:  dextrose  50 mL/hr at 10/04/23 1520  LOS: 1 day    Time spent: 35 minutes    Fonnie Iba, MD  [facility] Pager #: [phone] 7PM-7AM contact night coverage as above

## 2023-10-04 NOTE — Plan of Care (Signed)
  Problem: Metabolic: Goal: Ability to maintain appropriate glucose levels will improve Outcome: Progressing   Problem: Nutritional: Goal: Maintenance of adequate nutrition will improve Outcome: Progressing   Problem: Skin Integrity: Goal: Risk for impaired skin integrity will decrease Outcome: Progressing   Problem: Clinical Measurements: Goal: Diagnostic test results will improve Outcome: Progressing Goal: Cardiovascular complication will be avoided Outcome: Progressing   Problem: Activity: Goal: Risk for activity intolerance will decrease Outcome: Progressing   Problem: Safety: Goal: Ability to remain free from injury will improve Outcome: Progressing

## 2023-10-04 NOTE — TOC Initial Note (Signed)
 Transition of Care [facility]) - Initial/Assessment Note    Patient Details  Name: Dominique Simmons MRN: [id number] Date of Birth: 08-Jun-1934  Transition of [facility]) CM/SW Contact:    Juliane Och, LCSW Phone Number: 10/04/2023, 11:44 AM  Clinical Narrative:                  11:44 AM CSW introduced self and role to patient at bedside. Patient confirmed she resides home with spouse who she was primary caregiver of prior to this hospitalization. Patient confirmed son could provide transportation and home support upon discharge. Patient declined SNF/DME history but confirmed receiving home health services ([facility]) and attended outpatient therapy through [facility]. No TOC needs identified at this time.  Expected Discharge Plan: Home/Self Care Barriers to Discharge: Continued Medical Work up   Patient Goals and CMS Choice Patient states their goals for this hospitalization and ongoing recovery are:: to return home          Expected Discharge Plan and Services       Living arrangements for the past 2 months: Single Family Home                                      Prior Living Arrangements/Services Living arrangements for the past 2 months: Single Family Home Lives with:: Spouse Patient language and need for interpreter reviewed:: Yes              Criminal Activity/Legal Involvement Pertinent to Current Situation/Hospitalization: No - Comment as needed  Activities of Daily Living      Permission Sought/Granted Permission sought to share information with : Family Supports Permission granted to share information with : No (Contact information on chart)  Share Information with NAME: Gwynevere Lizana     Permission granted to share info w Relationship: Son  Permission granted to share info w Contact Information: [phone]  Emotional Assessment Appearance:: Appears stated age Attitude/Demeanor/Rapport: Engaged Affect (typically observed): Accepting,  Pleasant, Adaptable, Calm, Stable, Appropriate Orientation: : Oriented to Situation, Oriented to  Time, Oriented to Place, Oriented to Self Alcohol / Substance Use: Not Applicable Psych Involvement: No (comment)  Admission diagnosis:  Hyponatremia [E87.1] Generalized weakness [R53.1] Patient Active Problem List   Diagnosis Date Noted   Hyponatremia 09/16/2023   Hypokalemia 09/16/2023   Slurred speech 09/16/2023   Generalized weakness 09/16/2023   Essential hypertension 09/16/2023   Insulin  dependent type 2 diabetes mellitus (HCC) 09/16/2023   Acute cystitis 09/15/2023   Loss of weight 01/25/2020   Dysphagia 01/25/2020   Onychomycosis 03/19/2017   Plantar fasciitis, right 03/19/2017   PCP:  Virgle Grime, MD Pharmacy:   [id number] [place], [place] - [address] [place] [address] Phone: [phone] Fax: [phone]  [facility] [address] [place] [address] Phone: [phone] Fax: [phone]     Social Drivers of Health (SDOH) Social History: SDOH Screenings   Food Insecurity: No Food Insecurity (09/16/2023)  Housing: Low Risk  (09/16/2023)  Transportation Needs: No Transportation Needs (09/16/2023)  Utilities: Not At Risk (09/16/2023)  Social Connections: Socially Integrated (09/16/2023)  Tobacco Use: Low Risk  (10/03/2023)   SDOH Interventions:     Readmission Risk Interventions    09/16/2023    3:33 PM  Readmission Risk Prevention Plan  Post Dischage Appt Complete  Medication Screening Complete  Transportation Screening Complete

## 2023-10-05 DIAGNOSIS — E871 Hypo-osmolality and hyponatremia: Secondary | ICD-10-CM | POA: Diagnosis not present

## 2023-10-05 LAB — RENAL FUNCTION PANEL
Albumin: 2.7 g/dL — ABNORMAL LOW (ref 3.5–5.0)
Anion gap: 9 (ref 5–15)
BUN: 13 mg/dL (ref 8–23)
CO2: 20 mmol/L — ABNORMAL LOW (ref 22–32)
Calcium: 8.5 mg/dL — ABNORMAL LOW (ref 8.9–10.3)
Chloride: 95 mmol/L — ABNORMAL LOW (ref 98–111)
Creatinine, Ser: 0.8 mg/dL (ref 0.44–1.00)
GFR, Estimated: >60 mL/min (ref >60)
Glucose, Bld: 95 mg/dL (ref 70–99)
Phosphorus: 3 mg/dL (ref 2.5–4.6)
Potassium: 3.8 mmol/L (ref 3.5–5.1)
Sodium: 124 mmol/L — ABNORMAL LOW (ref 135–145)

## 2023-10-05 LAB — BASIC METABOLIC PANEL WITH GFR
Anion gap: 10 (ref 5–15)
BUN: 11 mg/dL (ref 8–23)
CO2: 21 mmol/L — ABNORMAL LOW (ref 22–32)
Calcium: 8.9 mg/dL (ref 8.9–10.3)
Chloride: 95 mmol/L — ABNORMAL LOW (ref 98–111)
Creatinine, Ser: 0.77 mg/dL (ref 0.44–1.00)
GFR, Estimated: >60 mL/min (ref >60)
Glucose, Bld: 130 mg/dL — ABNORMAL HIGH (ref 70–99)
Potassium: 3.8 mmol/L (ref 3.5–5.1)
Sodium: 126 mmol/L — ABNORMAL LOW (ref 135–145)

## 2023-10-05 LAB — GLUCOSE, CAPILLARY
Glucose-Capillary: 108 mg/dL — ABNORMAL HIGH (ref 70–99)
Glucose-Capillary: 127 mg/dL — ABNORMAL HIGH (ref 70–99)
Glucose-Capillary: 201 mg/dL — ABNORMAL HIGH (ref 70–99)
Glucose-Capillary: 195 mg/dL — ABNORMAL HIGH (ref 70–99)
Glucose-Capillary: 198 mg/dL — ABNORMAL HIGH (ref 70–99)
Glucose-Capillary: 114 mg/dL — ABNORMAL HIGH (ref 70–99)

## 2023-10-05 LAB — SODIUM
Sodium: 127 mmol/L — ABNORMAL LOW (ref 135–145)
Sodium: 123 mmol/L — ABNORMAL LOW (ref 135–145)
Sodium: 126 mmol/L — ABNORMAL LOW (ref 135–145)

## 2023-10-05 LAB — OSMOLALITY, URINE: Osmolality, Ur: 291 mosm/kg — ABNORMAL LOW (ref 300–900)

## 2023-10-05 LAB — OSMOLALITY: Osmolality: 272 mosm/kg — ABNORMAL LOW (ref 275–295)

## 2023-10-05 LAB — SODIUM, URINE, RANDOM: Sodium, Ur: 38 mmol/L

## 2023-10-05 MED ORDER — AMLODIPINE BESYLATE 5 MG PO TABS
2.5000 mg | ORAL_TABLET | Freq: Every day | ORAL | Status: DC
  Administered 2023-10-05 – 2023-10-06 (×2): 2.5 mg via ORAL
  Filled 2023-10-05 (×2): qty 1

## 2023-10-05 MED ORDER — SODIUM CHLORIDE 0.9 % IV SOLN: INTRAVENOUS | Status: AC

## 2023-10-05 NOTE — Plan of Care (Signed)
  Problem: Education: Goal: Ability to describe self-care measures that may prevent or decrease complications (Diabetes Survival Skills Education) will improve Outcome: Progressing   Problem: Coping: Goal: Ability to adjust to condition or change in health will improve Outcome: Progressing   Problem: Fluid Volume: Goal: Ability to maintain a balanced intake and output will improve Outcome: Progressing   Problem: Nutritional: Goal: Maintenance of adequate nutrition will improve Outcome: Progressing   Problem: Skin Integrity: Goal: Risk for impaired skin integrity will decrease Outcome: Progressing   Problem: Tissue Perfusion: Goal: Adequacy of tissue perfusion will improve Outcome: Progressing   Problem: Education: Goal: Knowledge of General Education information will improve Description: Including pain rating scale, medication(s)/side effects and non-pharmacologic comfort measures Outcome: Progressing

## 2023-10-05 NOTE — Progress Notes (Signed)
 PROGRESS NOTE    Dominique Simmons  XBM:[id number] DOB: Feb 04, 1934 DOA: 10/03/2023 PCP: Virgle Grime, MD  Outpatient Specialists:     Brief Narrative:  Patient is an 89 year old female with past medical history significant for hyponatremia, diabetes mellitus with complications, GERD, hyperlipidemia, hypertension and irritable bowel syndrome.  Patient was admitted with symptomatic hyponatremia, sodium of 115.  Apparently, patient has been on HCTZ.  Patient was initially managed with 3% saline.  10/04/2023: Sodium has improved to 125.  Will discontinue 3% saline.  Continue to monitor BMP.  Goal is for 6 to 8 mEq rise in sodium within first 24 hours.  Patient seen alongside patient's husband and son.  Patient feels much improved.  Will continue to manage hyponatremia.  Will avoid excessive rise in sodium level.  10/05/2023: Patient seen.  No new complaints.  5 further was discontinued last night.  Will start patient on normal saline 75 cc/h.  Monitor BMP Q 4 x 3.  Likely discharge tomorrow.   Assessment & Plan:   Principal Problem:   Hyponatremia   Symptomatic anemia: - Discontinued 3% saline. - Patient was on NSAIDs and HCTZ. - Last sodium level was 127. -Continue to monitor BMP every 4 hours x 3.  Hypertension: - Controlled. -Continue to monitor closely. - Discontinued HCTZ.  History of diabetes mellitus: - Sliding scale insulin  coverage.   DVT prophylaxis: Subcutaneous heparin . Code Status: DO NOT RESUSCITATE. Family Communication: Husband and son. Disposition Plan: Patient remains inpatient.   Consultants:  None.  Procedures:  None.  Antimicrobials:  None.   Subjective: Fatigue has improved significantly.  Objective: Vitals:   10/05/23 0500 10/05/23 0700 10/05/23 0752 10/05/23 0800  BP: 127/60 126/63  (!) 117/59  Pulse: 70 80 79 80  Resp: 15 14 15 14   Temp:   (!) 97.5 F (36.4 C)   TempSrc:   Oral   SpO2: 96% 98% 99% 97%  Weight: 55.3 kg        Intake/Output Summary (Last 24 hours) at 10/05/2023 0916 Last data filed at 10/05/2023 0600 Gross per 24 hour  Intake 1323.22 ml  Output 1050 ml  Net 273.22 ml   Filed Weights   10/04/23 0500 10/05/23 0500  Weight: 65 kg 55.3 kg    Examination:  General exam: Appears calm and comfortable  Respiratory system: Clear to auscultation.  Cardiovascular system: S1 & S2 heard, RRR.  Gastrointestinal system: Abdomen is nondistended, soft and nontender.  Central nervous system: Alert and oriented. . Extremities: No leg edema.  Data Reviewed: I have personally reviewed following labs and imaging studies  CBC: Recent Labs  Lab 10/03/23 1218 10/04/23 0339  WBC 10.1 6.9  HGB 12.4 11.6*  HCT 35.3* 31.9*  MCV 87.2 85.3  PLT 504* 368   Basic Metabolic Panel: Recent Labs  Lab 10/03/23 1218 10/03/23 2000 10/04/23 0339 10/04/23 1021 10/04/23 1418 10/04/23 1611 10/04/23 1916 10/05/23 0316  NA 115*   < > 121* 125* 127* 125* 126* 124*  K 4.9  --  4.1  --  3.9 3.8 4.4 3.8  CL 84*  --  87*  --  96* 93* 96* 95*  CO2 17*  --  21*  --  21* 21* 20* 20*  GLUCOSE 156*  --  136*  --  230* 178* 81 95  BUN 17  --  15  --  12 14 15 13   CREATININE 1.12*  --  0.87  --  0.91 0.99 1.02* 0.80  CALCIUM 8.8*  --  8.8*  --  8.5* 8.4* 8.6* 8.5*  MG 1.8  --  1.9  --   --   --   --   --   PHOS  --   --  3.8  --   --   --   --  3.0   < > = values in this interval not displayed.   GFR: Estimated Creatinine Clearance: 42.4 mL/min (by C-G formula based on SCr of 0.8 mg/dL). Liver Function Tests: Recent Labs  Lab 10/03/23 1218 10/05/23 0316  AST 25  --   ALT 22  --   ALKPHOS 65  --   BILITOT 0.9  --   PROT 7.3  --   ALBUMIN 3.3* 2.7*   No results for input(s): "LIPASE", "AMYLASE" in the last 168 hours. No results for input(s): "AMMONIA" in the last 168 hours. Coagulation Profile: No results for input(s): "INR", "PROTIME" in the last 168 hours. Cardiac Enzymes: No results for input(s):  "CKTOTAL", "CKMB", "CKMBINDEX", "TROPONINI" in the last 168 hours. BNP (last 3 results) No results for input(s): "PROBNP" in the last 8760 hours. HbA1C: No results for input(s): "HGBA1C" in the last 72 hours. CBG: Recent Labs  Lab 10/04/23 1600 10/04/23 2000 10/04/23 2324 10/05/23 0315 10/05/23 0751  GLUCAP 193* 90 225* 108* 127*   Lipid Profile: No results for input(s): "CHOL", "HDL", "LDLCALC", "TRIG", "CHOLHDL", "LDLDIRECT" in the last 72 hours. Thyroid  Function Tests: Recent Labs    10/03/23 2000  TSH 2.731   Anemia Panel: No results for input(s): "VITAMINB12", "FOLATE", "FERRITIN", "TIBC", "IRON", "RETICCTPCT" in the last 72 hours. Urine analysis:    Component Value Date/Time   COLORURINE YELLOW 10/03/2023 2300   APPEARANCEUR CLEAR 10/03/2023 2300   LABSPEC 1.006 10/03/2023 2300   PHURINE 7.0 10/03/2023 2300   GLUCOSEU NEGATIVE 10/03/2023 2300   HGBUR NEGATIVE 10/03/2023 2300   BILIRUBINUR NEGATIVE 10/03/2023 2300   KETONESUR NEGATIVE 10/03/2023 2300   PROTEINUR NEGATIVE 10/03/2023 2300   UROBILINOGEN 0.2 11/27/2006 0815   NITRITE NEGATIVE 10/03/2023 2300   LEUKOCYTESUR NEGATIVE 10/03/2023 2300   Sepsis Labs: @LABRCNTIP (procalcitonin:4,lacticidven:4)  ) Recent Results (from the past 240 hours)  MRSA Next Gen by PCR, Nasal     Status: None   Collection Time: 10/03/23  5:39 PM   Specimen: Nasal Mucosa; Nasal Swab  Result Value Ref Range Status   MRSA by PCR Next Gen NOT DETECTED NOT DETECTED Final    Comment: (NOTE) The GeneXpert MRSA Assay (FDA approved for NASAL specimens only), is one component of a comprehensive MRSA colonization surveillance program. It is not intended to diagnose MRSA infection nor to guide or monitor treatment for MRSA infections. Test performance is not FDA approved in patients less than 53 years old. Performed at [facility], [address] [place], [place] [address]          Radiology Studies: US  EKG SITE  RITE Result Date: 10/03/2023 If Site Rite image not attached, placement could not be confirmed due to current cardiac rhythm.  CT Head Wo Contrast Result Date: 10/03/2023 CLINICAL DATA:  Trauma, hyponatremia, falls and seizures. EXAM: CT HEAD WITHOUT CONTRAST CT CERVICAL SPINE WITHOUT CONTRAST TECHNIQUE: Multidetector CT imaging of the head and cervical spine was performed following the standard protocol without intravenous contrast. Multiplanar CT image reconstructions of the cervical spine were also generated. RADIATION DOSE REDUCTION: This exam was performed according to the departmental dose-optimization program which includes automated exposure control, adjustment of the mA and/or kV according to  patient size and/or use of iterative reconstruction technique. COMPARISON:  MRI head 09/16/2023, MRI cervical spine 03/16/2009. FINDINGS: CT HEAD FINDINGS Brain: No acute intracranial hemorrhage. No CT evidence of acute infarct. Nonspecific hypoattenuation in the periventricular and subcortical white matter favored to reflect chronic microvascular ischemic changes. No edema, mass effect, or midline shift. The basilar cisterns are patent. Ventricles: The ventricles are normal. Vascular: Atherosclerotic calcifications of the carotid siphons. No hyperdense vessel. Skull: No acute or aggressive finding. Orbits: Orbits are symmetric. Sinuses: The visualized paranasal sinuses are clear. Other: Mastoid air cells are clear. CT CERVICAL SPINE FINDINGS Alignment: Straightening of the normal cervical lordosis. No listhesis. No facet subluxation or dislocation. Skull base and vertebrae: No compression fracture or displaced fracture in the cervical spine. No suspicious osseous lesion. Soft tissues and spinal canal: No prevertebral fluid or swelling. No visible canal hematoma. Disc levels: Severe disc space narrowing at C5-6. Additional mild disc space narrowing at multiple levels. Small disc bulges at multiple levels. Disc  osteophyte complex at C5-6 resulting in mild spinal canal stenosis. Facet arthrosis and uncovertebral hypertrophy throughout the cervical spine. Foraminal narrowing is most pronounced at C3-4 and C5-6. Upper chest: Biapical pleuroparenchymal scarring. Other: None. IMPRESSION: No CT evidence of acute intracranial abnormality. No acute fracture or traumatic malalignment of the cervical spine. Degenerative changes of the cervical spine as above. Mild chronic microvascular ischemic changes. Electronically Signed   By: Denny Flack M.D.   On: 10/03/2023 14:51   CT Cervical Spine Wo Contrast Result Date: 10/03/2023 CLINICAL DATA:  Trauma, hyponatremia, falls and seizures. EXAM: CT HEAD WITHOUT CONTRAST CT CERVICAL SPINE WITHOUT CONTRAST TECHNIQUE: Multidetector CT imaging of the head and cervical spine was performed following the standard protocol without intravenous contrast. Multiplanar CT image reconstructions of the cervical spine were also generated. RADIATION DOSE REDUCTION: This exam was performed according to the departmental dose-optimization program which includes automated exposure control, adjustment of the mA and/or kV according to patient size and/or use of iterative reconstruction technique. COMPARISON:  MRI head 09/16/2023, MRI cervical spine 03/16/2009. FINDINGS: CT HEAD FINDINGS Brain: No acute intracranial hemorrhage. No CT evidence of acute infarct. Nonspecific hypoattenuation in the periventricular and subcortical white matter favored to reflect chronic microvascular ischemic changes. No edema, mass effect, or midline shift. The basilar cisterns are patent. Ventricles: The ventricles are normal. Vascular: Atherosclerotic calcifications of the carotid siphons. No hyperdense vessel. Skull: No acute or aggressive finding. Orbits: Orbits are symmetric. Sinuses: The visualized paranasal sinuses are clear. Other: Mastoid air cells are clear. CT CERVICAL SPINE FINDINGS Alignment: Straightening of the  normal cervical lordosis. No listhesis. No facet subluxation or dislocation. Skull base and vertebrae: No compression fracture or displaced fracture in the cervical spine. No suspicious osseous lesion. Soft tissues and spinal canal: No prevertebral fluid or swelling. No visible canal hematoma. Disc levels: Severe disc space narrowing at C5-6. Additional mild disc space narrowing at multiple levels. Small disc bulges at multiple levels. Disc osteophyte complex at C5-6 resulting in mild spinal canal stenosis. Facet arthrosis and uncovertebral hypertrophy throughout the cervical spine. Foraminal narrowing is most pronounced at C3-4 and C5-6. Upper chest: Biapical pleuroparenchymal scarring. Other: None. IMPRESSION: No CT evidence of acute intracranial abnormality. No acute fracture or traumatic malalignment of the cervical spine. Degenerative changes of the cervical spine as above. Mild chronic microvascular ischemic changes. Electronically Signed   By: Denny Flack M.D.   On: 10/03/2023 14:51        Scheduled Meds:  amLODipine   2.5 mg Oral Daily   aspirin   81 mg Oral Daily   Chlorhexidine  Gluconate Cloth  6 each Topical Daily   heparin   5,000 Units Subcutaneous Q8H   insulin  aspart  0-9 Units Subcutaneous Q4H   Continuous Infusions:  sodium chloride        LOS: 2 days    Time spent: 35 minutes    Fonnie Iba, MD  [facility] Pager #: [phone] 7PM-7AM contact night coverage as above

## 2023-10-06 DIAGNOSIS — E871 Hypo-osmolality and hyponatremia: Secondary | ICD-10-CM | POA: Diagnosis not present

## 2023-10-06 LAB — RENAL FUNCTION PANEL
Albumin: 2.7 g/dL — ABNORMAL LOW (ref 3.5–5.0)
Anion gap: 9 (ref 5–15)
BUN: 8 mg/dL (ref 8–23)
CO2: 19 mmol/L — ABNORMAL LOW (ref 22–32)
Calcium: 8.4 mg/dL — ABNORMAL LOW (ref 8.9–10.3)
Chloride: 101 mmol/L (ref 98–111)
Creatinine, Ser: 0.71 mg/dL (ref 0.44–1.00)
GFR, Estimated: >60 mL/min (ref >60)
Glucose, Bld: 118 mg/dL — ABNORMAL HIGH (ref 70–99)
Phosphorus: 2.7 mg/dL (ref 2.5–4.6)
Potassium: 4.3 mmol/L (ref 3.5–5.1)
Sodium: 129 mmol/L — ABNORMAL LOW (ref 135–145)

## 2023-10-06 LAB — GLUCOSE, CAPILLARY
Glucose-Capillary: 119 mg/dL — ABNORMAL HIGH (ref 70–99)
Glucose-Capillary: 122 mg/dL — ABNORMAL HIGH (ref 70–99)
Glucose-Capillary: 153 mg/dL — ABNORMAL HIGH (ref 70–99)

## 2023-10-06 MED ORDER — DOCUSATE SODIUM 100 MG PO CAPS: 100.0000 mg | ORAL_CAPSULE | Freq: Two times a day (BID) | ORAL | 0 refills | Status: AC | PRN

## 2023-10-06 MED ORDER — POLYETHYLENE GLYCOL 3350 17 G PO PACK: 17.0000 g | PACK | Freq: Every day | ORAL | 0 refills | Status: AC | PRN

## 2023-10-06 MED ORDER — LOSARTAN POTASSIUM 25 MG PO TABS: 12.5000 mg | ORAL_TABLET | Freq: Every day | ORAL | 0 refills | Status: AC

## 2023-10-06 NOTE — Plan of Care (Signed)

## 2023-10-06 NOTE — TOC Transition Note (Signed)
 Transition of Care [facility]) - Discharge Note   Patient Details  Name: Dominique Simmons MRN: [id number] Date of Birth: 1933-08-18  Transition [facility]) CM/SW Contact:  Omie Bickers, RN Phone Number: 10/06/2023, 12:07 PM   Clinical Narrative:     Per chart review patient is active w [facility]. Placed ROS order and notified liaison of hospital discharge   Final next level of care: Home w Home Health Services Barriers to Discharge: No Barriers Identified   Patient Goals and CMS Choice Patient states their goals for this hospitalization and ongoing recovery are:: to return home          Discharge Placement                       Discharge Plan and Services Additional resources added to the After Visit Summary for                              [facility] Agency: [facility] Date [facility] Agency Contacted: 10/06/23 Time HH Agency Contacted: 1207 Representative spoke with at [facility] Agency: Amy  Social Drivers of Health (SDOH) Interventions SDOH Screenings   Food Insecurity: No Food Insecurity (10/04/2023)  Housing: Low Risk  (10/04/2023)  Transportation Needs: No Transportation Needs (10/04/2023)  Utilities: Not At Risk (10/04/2023)  Social Connections: Socially Integrated (10/04/2023)  Tobacco Use: Low Risk  (10/03/2023)     Readmission Risk Interventions    09/16/2023    3:33 PM  Readmission Risk Prevention Plan  Post Dischage Appt Complete  Medication Screening Complete  Transportation Screening Complete

## 2023-10-07 ENCOUNTER — Telehealth: Payer: Self-pay | Admitting: *Deleted

## 2023-10-07 NOTE — Transitions of Care (Post Inpatient/ED Visit) (Signed)
   10/07/2023  Name: Albirda B Reller MRN: [id number] DOB: 05-May-1934  Today's TOC FU Call Status: Today's TOC FU Call Status:: Unsuccessful Call (1st Attempt) Unsuccessful Call (1st Attempt) Date: 10/07/23  Attempted to reach the patient regarding the most recent Inpatient/ED visit.  Follow Up Plan: Additional outreach attempts will be made to reach the patient to complete the Transitions of Care (Post Inpatient/ED visit) call.   Una Ganser BSN RN [place] [facility] Health Care Management Coordinator [email or web address] Direct Dial: [phone]  Fax: [phone] Website: [email or web address]

## 2023-10-07 NOTE — Transitions of Care (Post Inpatient/ED Visit) (Signed)
 10/07/2023  Name: Dominique Simmons MRN: [id number] DOB: [birth year]  Today's TOC FU Call Status: Today's TOC FU Call Status:: Successful TOC FU Call Completed TOC FU Call Complete Date: 10/07/23 Patient's Name and Date of Birth confirmed.  Transition Care Management Follow-up Telephone Call Date of Discharge: 10/06/23 [facility]charge Facility: [facility]) Type of Discharge: Inpatient Admission Primary Inpatient Discharge Diagnosis:: Hyponatremia How have you been since you were released from the hospital?: Better (Doing ok) Any questions or concerns?: No  Items Reviewed: Did you receive and understand the discharge instructions provided?: Yes Medications obtained,verified, and reconciled?: Yes (Medications Reviewed) Any new allergies since your discharge?: No Dietary orders reviewed?: No Do you have support at home?: Yes People in Home [RPT]: spouse Name of Support/Comfort Primary Source: Moir spouse  Medications Reviewed Today: Medications Reviewed Today     Reviewed by Eilene Grater, RN (Case Manager) on 10/07/23 at 1403  Med List Status: <None>   Medication Order Taking? Sig Documenting Provider Last Dose Status Informant  acetaminophen  (TYLENOL ) 325 MG tablet [id number] Yes Take 650 mg by mouth every 6 (six) hours as needed for mild pain (pain score 1-3). [provider] Taking Active Self, Child, Pharmacy Records  aspirin  81 MG tablet [id number] Yes Take 81 mg by mouth daily. [provider] Taking Active Self, Child, Pharmacy Records  docusate sodium  (COLACE) 100 MG capsule [id number] Yes Take 1 capsule (100 mg total) by mouth 2 (two) times daily as needed for mild constipation. Doroteo Gasmen, MD Taking Active   losartan  (COZAAR ) 25 MG tablet [id number] Yes Take 0.5 tablets (12.5 mg total) by mouth daily. Doroteo Gasmen, MD Taking Active   metFORMIN (GLUCOPHAGE) 500 MG tablet [id number] Yes Take 2 tablets by mouth every evening. [provider] Taking Active Self, Child, Pharmacy Records  polyethylene glycol (MIRALAX  / GLYCOLAX ) 17 g packet [id number] Yes Take 17 g by mouth daily as needed for moderate constipation. Doroteo Gasmen, MD Taking Active   TOUJEO  SOLOSTAR 300 UNIT/ML Solostar Pen [id number] Yes Inject 12 Units into the skin daily. [provider] Taking Active Self, Child, Pharmacy Records           Med Note (S[place]TERFIELD, Ken Patty   Sun Sep 15, 2023  8:52 PM) Patient and son verified she is taking this medication             Home Care and Equipment/Supplies: Were Home Health Services Ordered?: Yes Name of Home Health Agency:: [facility] Has Agency set up a time to come to your home?: No EMR reviewed for Home Health Orders: Orders present/patient has not received call (refer to CM for follow-up) (Patient has phone number to contact) Any new equipment or medical supplies ordered?: NA  Functional Questionnaire: Do you need assistance with bathing/showering or dressing?: No Do you need assistance with meal preparation?: Yes Do you need assistance with eating?: No Do you have difficulty maintaining continence: No Do you need assistance with getting out of bed/getting out of a chair/moving?: No Do you have difficulty managing or taking your medications?: No  Follow up appointments reviewed: PCP Follow-up appointment confirmed?: Yes Date of PCP follow-up appointment?: 10/09/23 Follow-up Provider: Dr Gloria La[facility] Follow-up appointment confirmed?: NA Do you need transportation to your follow-up appointment?: No Do you understand care options if your condition(s) worsen?: Yes-patient verbalized understanding  SDOH Interventions Today    Flowsheet Row Most Recent Value  SDOH Interventions   Food Insecurity Interventions Intervention Not Indicated  Housing Interventions Intervention Not Indicated  Transportation Interventions Intervention Not Indicated, Patient Resources  (Friends/Family)  Utilities Interventions Intervention Not Indicated         Una Ganser BSN RN [facility] Health Care Management Coordinator [email or web address] Direct Dial: [phone]  Fax: [phone] Website: [email or web address]

## 2023-10-09 DIAGNOSIS — G720 Drug-induced myopathy: Secondary | ICD-10-CM | POA: Diagnosis not present

## 2023-10-09 DIAGNOSIS — E1122 Type 2 diabetes mellitus with diabetic chronic kidney disease: Secondary | ICD-10-CM | POA: Diagnosis not present

## 2023-10-09 DIAGNOSIS — E871 Hypo-osmolality and hyponatremia: Secondary | ICD-10-CM | POA: Diagnosis not present

## 2023-10-09 DIAGNOSIS — N182 Chronic kidney disease, stage 2 (mild): Secondary | ICD-10-CM | POA: Diagnosis not present

## 2023-10-09 DIAGNOSIS — I129 Hypertensive chronic kidney disease with stage 1 through stage 4 chronic kidney disease, or unspecified chronic kidney disease: Secondary | ICD-10-CM | POA: Diagnosis not present

## 2023-10-09 DIAGNOSIS — R2681 Unsteadiness on feet: Secondary | ICD-10-CM | POA: Diagnosis not present

## 2023-10-09 DIAGNOSIS — E782 Mixed hyperlipidemia: Secondary | ICD-10-CM | POA: Diagnosis not present

## 2023-10-09 DIAGNOSIS — R296 Repeated falls: Secondary | ICD-10-CM | POA: Diagnosis not present

## 2023-10-09 DIAGNOSIS — G609 Hereditary and idiopathic neuropathy, unspecified: Secondary | ICD-10-CM | POA: Diagnosis not present

## 2023-10-09 DIAGNOSIS — Z09 Encounter for follow-up examination after completed treatment for conditions other than malignant neoplasm: Secondary | ICD-10-CM | POA: Diagnosis not present

## 2023-10-09 DIAGNOSIS — Z794 Long term (current) use of insulin: Secondary | ICD-10-CM | POA: Diagnosis not present

## 2023-10-12 DIAGNOSIS — M1990 Unspecified osteoarthritis, unspecified site: Secondary | ICD-10-CM | POA: Diagnosis not present

## 2023-10-12 DIAGNOSIS — Z794 Long term (current) use of insulin: Secondary | ICD-10-CM | POA: Diagnosis not present

## 2023-10-12 DIAGNOSIS — K219 Gastro-esophageal reflux disease without esophagitis: Secondary | ICD-10-CM | POA: Diagnosis not present

## 2023-10-12 DIAGNOSIS — I10 Essential (primary) hypertension: Secondary | ICD-10-CM | POA: Diagnosis not present

## 2023-10-12 DIAGNOSIS — E785 Hyperlipidemia, unspecified: Secondary | ICD-10-CM | POA: Diagnosis not present

## 2023-10-12 DIAGNOSIS — N3000 Acute cystitis without hematuria: Secondary | ICD-10-CM | POA: Diagnosis not present

## 2023-10-12 DIAGNOSIS — E1136 Type 2 diabetes mellitus with diabetic cataract: Secondary | ICD-10-CM | POA: Diagnosis not present

## 2023-10-12 DIAGNOSIS — K449 Diaphragmatic hernia without obstruction or gangrene: Secondary | ICD-10-CM | POA: Diagnosis not present

## 2023-10-12 DIAGNOSIS — K589 Irritable bowel syndrome without diarrhea: Secondary | ICD-10-CM | POA: Diagnosis not present

## 2023-10-18 DIAGNOSIS — I10 Essential (primary) hypertension: Secondary | ICD-10-CM | POA: Diagnosis not present

## 2023-10-18 DIAGNOSIS — Z794 Long term (current) use of insulin: Secondary | ICD-10-CM | POA: Diagnosis not present

## 2023-10-18 DIAGNOSIS — M1990 Unspecified osteoarthritis, unspecified site: Secondary | ICD-10-CM | POA: Diagnosis not present

## 2023-10-18 DIAGNOSIS — N3000 Acute cystitis without hematuria: Secondary | ICD-10-CM | POA: Diagnosis not present

## 2023-10-18 DIAGNOSIS — K219 Gastro-esophageal reflux disease without esophagitis: Secondary | ICD-10-CM | POA: Diagnosis not present

## 2023-10-18 DIAGNOSIS — E1136 Type 2 diabetes mellitus with diabetic cataract: Secondary | ICD-10-CM | POA: Diagnosis not present

## 2023-10-18 DIAGNOSIS — E785 Hyperlipidemia, unspecified: Secondary | ICD-10-CM | POA: Diagnosis not present

## 2023-10-18 DIAGNOSIS — K449 Diaphragmatic hernia without obstruction or gangrene: Secondary | ICD-10-CM | POA: Diagnosis not present

## 2023-10-18 DIAGNOSIS — K589 Irritable bowel syndrome without diarrhea: Secondary | ICD-10-CM | POA: Diagnosis not present

## 2023-10-22 DIAGNOSIS — E871 Hypo-osmolality and hyponatremia: Secondary | ICD-10-CM | POA: Diagnosis not present

## 2023-10-22 DIAGNOSIS — E876 Hypokalemia: Secondary | ICD-10-CM | POA: Diagnosis not present

## 2023-10-22 DIAGNOSIS — E1122 Type 2 diabetes mellitus with diabetic chronic kidney disease: Secondary | ICD-10-CM | POA: Diagnosis not present

## 2023-10-22 DIAGNOSIS — E1136 Type 2 diabetes mellitus with diabetic cataract: Secondary | ICD-10-CM | POA: Diagnosis not present

## 2023-10-22 DIAGNOSIS — I129 Hypertensive chronic kidney disease with stage 1 through stage 4 chronic kidney disease, or unspecified chronic kidney disease: Secondary | ICD-10-CM | POA: Diagnosis not present

## 2023-10-22 DIAGNOSIS — Z794 Long term (current) use of insulin: Secondary | ICD-10-CM | POA: Diagnosis not present

## 2023-10-22 DIAGNOSIS — N3000 Acute cystitis without hematuria: Secondary | ICD-10-CM | POA: Diagnosis not present

## 2023-10-22 DIAGNOSIS — B3781 Candidal esophagitis: Secondary | ICD-10-CM | POA: Diagnosis not present

## 2023-10-22 DIAGNOSIS — N183 Chronic kidney disease, stage 3 unspecified: Secondary | ICD-10-CM | POA: Diagnosis not present

## 2023-10-24 DIAGNOSIS — E876 Hypokalemia: Secondary | ICD-10-CM | POA: Diagnosis not present

## 2023-10-24 DIAGNOSIS — E871 Hypo-osmolality and hyponatremia: Secondary | ICD-10-CM | POA: Diagnosis not present

## 2023-10-24 DIAGNOSIS — E1136 Type 2 diabetes mellitus with diabetic cataract: Secondary | ICD-10-CM | POA: Diagnosis not present

## 2023-10-24 DIAGNOSIS — Z794 Long term (current) use of insulin: Secondary | ICD-10-CM | POA: Diagnosis not present

## 2023-10-24 DIAGNOSIS — I129 Hypertensive chronic kidney disease with stage 1 through stage 4 chronic kidney disease, or unspecified chronic kidney disease: Secondary | ICD-10-CM | POA: Diagnosis not present

## 2023-10-24 DIAGNOSIS — N3000 Acute cystitis without hematuria: Secondary | ICD-10-CM | POA: Diagnosis not present

## 2023-10-24 DIAGNOSIS — B3781 Candidal esophagitis: Secondary | ICD-10-CM | POA: Diagnosis not present

## 2023-10-24 DIAGNOSIS — N183 Chronic kidney disease, stage 3 unspecified: Secondary | ICD-10-CM | POA: Diagnosis not present

## 2023-10-24 DIAGNOSIS — E1122 Type 2 diabetes mellitus with diabetic chronic kidney disease: Secondary | ICD-10-CM | POA: Diagnosis not present

## 2023-10-29 DIAGNOSIS — B3781 Candidal esophagitis: Secondary | ICD-10-CM | POA: Diagnosis not present

## 2023-10-29 DIAGNOSIS — N183 Chronic kidney disease, stage 3 unspecified: Secondary | ICD-10-CM | POA: Diagnosis not present

## 2023-10-29 DIAGNOSIS — E876 Hypokalemia: Secondary | ICD-10-CM | POA: Diagnosis not present

## 2023-10-29 DIAGNOSIS — I129 Hypertensive chronic kidney disease with stage 1 through stage 4 chronic kidney disease, or unspecified chronic kidney disease: Secondary | ICD-10-CM | POA: Diagnosis not present

## 2023-10-29 DIAGNOSIS — E1136 Type 2 diabetes mellitus with diabetic cataract: Secondary | ICD-10-CM | POA: Diagnosis not present

## 2023-10-29 DIAGNOSIS — E871 Hypo-osmolality and hyponatremia: Secondary | ICD-10-CM | POA: Diagnosis not present

## 2023-10-29 DIAGNOSIS — Z794 Long term (current) use of insulin: Secondary | ICD-10-CM | POA: Diagnosis not present

## 2023-10-29 DIAGNOSIS — E1122 Type 2 diabetes mellitus with diabetic chronic kidney disease: Secondary | ICD-10-CM | POA: Diagnosis not present

## 2023-10-29 DIAGNOSIS — N3000 Acute cystitis without hematuria: Secondary | ICD-10-CM | POA: Diagnosis not present

## 2023-10-31 DIAGNOSIS — B3781 Candidal esophagitis: Secondary | ICD-10-CM | POA: Diagnosis not present

## 2023-10-31 DIAGNOSIS — I129 Hypertensive chronic kidney disease with stage 1 through stage 4 chronic kidney disease, or unspecified chronic kidney disease: Secondary | ICD-10-CM | POA: Diagnosis not present

## 2023-10-31 DIAGNOSIS — Z794 Long term (current) use of insulin: Secondary | ICD-10-CM | POA: Diagnosis not present

## 2023-10-31 DIAGNOSIS — E876 Hypokalemia: Secondary | ICD-10-CM | POA: Diagnosis not present

## 2023-10-31 DIAGNOSIS — E1136 Type 2 diabetes mellitus with diabetic cataract: Secondary | ICD-10-CM | POA: Diagnosis not present

## 2023-10-31 DIAGNOSIS — E1122 Type 2 diabetes mellitus with diabetic chronic kidney disease: Secondary | ICD-10-CM | POA: Diagnosis not present

## 2023-10-31 DIAGNOSIS — E871 Hypo-osmolality and hyponatremia: Secondary | ICD-10-CM | POA: Diagnosis not present

## 2023-10-31 DIAGNOSIS — N3000 Acute cystitis without hematuria: Secondary | ICD-10-CM | POA: Diagnosis not present

## 2023-10-31 DIAGNOSIS — N183 Chronic kidney disease, stage 3 unspecified: Secondary | ICD-10-CM | POA: Diagnosis not present

## 2023-11-04 DIAGNOSIS — E871 Hypo-osmolality and hyponatremia: Secondary | ICD-10-CM | POA: Diagnosis not present

## 2023-11-04 DIAGNOSIS — Z794 Long term (current) use of insulin: Secondary | ICD-10-CM | POA: Diagnosis not present

## 2023-11-04 DIAGNOSIS — I129 Hypertensive chronic kidney disease with stage 1 through stage 4 chronic kidney disease, or unspecified chronic kidney disease: Secondary | ICD-10-CM | POA: Diagnosis not present

## 2023-11-04 DIAGNOSIS — E1136 Type 2 diabetes mellitus with diabetic cataract: Secondary | ICD-10-CM | POA: Diagnosis not present

## 2023-11-04 DIAGNOSIS — E1122 Type 2 diabetes mellitus with diabetic chronic kidney disease: Secondary | ICD-10-CM | POA: Diagnosis not present

## 2023-11-04 DIAGNOSIS — E876 Hypokalemia: Secondary | ICD-10-CM | POA: Diagnosis not present

## 2023-11-04 DIAGNOSIS — N183 Chronic kidney disease, stage 3 unspecified: Secondary | ICD-10-CM | POA: Diagnosis not present

## 2023-11-04 DIAGNOSIS — B3781 Candidal esophagitis: Secondary | ICD-10-CM | POA: Diagnosis not present

## 2023-11-04 DIAGNOSIS — N3000 Acute cystitis without hematuria: Secondary | ICD-10-CM | POA: Diagnosis not present

## 2023-11-07 DIAGNOSIS — N3000 Acute cystitis without hematuria: Secondary | ICD-10-CM | POA: Diagnosis not present

## 2023-11-07 DIAGNOSIS — E871 Hypo-osmolality and hyponatremia: Secondary | ICD-10-CM | POA: Diagnosis not present

## 2023-11-07 DIAGNOSIS — E1136 Type 2 diabetes mellitus with diabetic cataract: Secondary | ICD-10-CM | POA: Diagnosis not present

## 2023-11-07 DIAGNOSIS — E876 Hypokalemia: Secondary | ICD-10-CM | POA: Diagnosis not present

## 2023-11-07 DIAGNOSIS — N183 Chronic kidney disease, stage 3 unspecified: Secondary | ICD-10-CM | POA: Diagnosis not present

## 2023-11-07 DIAGNOSIS — B3781 Candidal esophagitis: Secondary | ICD-10-CM | POA: Diagnosis not present

## 2023-11-07 DIAGNOSIS — E1122 Type 2 diabetes mellitus with diabetic chronic kidney disease: Secondary | ICD-10-CM | POA: Diagnosis not present

## 2023-11-07 DIAGNOSIS — Z794 Long term (current) use of insulin: Secondary | ICD-10-CM | POA: Diagnosis not present

## 2023-11-07 DIAGNOSIS — I129 Hypertensive chronic kidney disease with stage 1 through stage 4 chronic kidney disease, or unspecified chronic kidney disease: Secondary | ICD-10-CM | POA: Diagnosis not present

## 2023-11-08 DIAGNOSIS — R296 Repeated falls: Secondary | ICD-10-CM | POA: Diagnosis not present

## 2023-11-08 DIAGNOSIS — R2681 Unsteadiness on feet: Secondary | ICD-10-CM | POA: Diagnosis not present

## 2023-11-13 DIAGNOSIS — E871 Hypo-osmolality and hyponatremia: Secondary | ICD-10-CM | POA: Diagnosis not present

## 2023-11-13 DIAGNOSIS — B3781 Candidal esophagitis: Secondary | ICD-10-CM | POA: Diagnosis not present

## 2023-11-13 DIAGNOSIS — N3000 Acute cystitis without hematuria: Secondary | ICD-10-CM | POA: Diagnosis not present

## 2023-11-13 DIAGNOSIS — Z794 Long term (current) use of insulin: Secondary | ICD-10-CM | POA: Diagnosis not present

## 2023-11-13 DIAGNOSIS — I129 Hypertensive chronic kidney disease with stage 1 through stage 4 chronic kidney disease, or unspecified chronic kidney disease: Secondary | ICD-10-CM | POA: Diagnosis not present

## 2023-11-13 DIAGNOSIS — N183 Chronic kidney disease, stage 3 unspecified: Secondary | ICD-10-CM | POA: Diagnosis not present

## 2023-11-13 DIAGNOSIS — E1136 Type 2 diabetes mellitus with diabetic cataract: Secondary | ICD-10-CM | POA: Diagnosis not present

## 2023-11-13 DIAGNOSIS — E876 Hypokalemia: Secondary | ICD-10-CM | POA: Diagnosis not present

## 2023-11-13 DIAGNOSIS — E1122 Type 2 diabetes mellitus with diabetic chronic kidney disease: Secondary | ICD-10-CM | POA: Diagnosis not present

## 2023-11-14 NOTE — Discharge Summary (Signed)
 Physician Discharge Summary  Patient ID: ERICK OXENDINE MRN: [id number] DOB/AGE: 1934-06-04 89 y.o.  Admit date: 10/03/2023 Discharge date: 10/06/2023   Admission Diagnoses:  Discharge Diagnoses:  Principal Problem:   Hyponatremia   Discharged Condition: stable  Hospital Course:  Patient is an 89 year old female with past medical history significant for hyponatremia, diabetes mellitus with complications, GERD, hyperlipidemia, hypertension and irritable bowel syndrome.  Patient was admitted with symptomatic hyponatremia, sodium of 115.  Apparently, patient was been on hydrochlorothiazide prior to admission.  Patient was initially managed with 3% saline.  Sodium gradually improved to 129 prior to discharge.    Symptomatic hyponatremia: - Initially managed with 3% saline. - Patient was on NSAIDs and HCTZ. - Sodium improved to 129 prior to discharge    Hypertension: - Controlled. -Continue to monitor closely. - Discontinued HCTZ.   History of diabetes mellitus: - Sliding scale insulin  coverage.  Consults:  Patient was admitted by the ICU team  Significant Diagnostic Studies:  Sodium of 115 on admission, improved to 129 prior to discharge.  Treatments:  Hypertonic saline  Discharge Exam: Blood pressure 130/73, pulse 99, temperature 98.7 F (37.1 C), temperature source Oral, resp. rate 14, weight 55.3 kg, SpO2 93%.   Disposition: Discharge disposition: 01-Home or Self Care       Discharge Instructions     Diet - low sodium heart healthy   Complete by: As directed    Diet Carb Modified   Complete by: As directed    Increase activity slowly   Complete by: As directed       Allergies as of 10/06/2023       Reactions   Amoxicillin    REACTION: rash   Lescol [fluvastatin Sodium]    Myalgia   Lipitor [atorvastatin]    Myalgia   Lisinopril    Cough   Pravachol [pravastatin Sodium]    Myalgia        Medication List     STOP taking these medications     amLODipine  5 MG tablet Commonly known as: NORVASC    clotrimazole-betamethasone cream Commonly known as: LOTRISONE   meloxicam 15 MG tablet Commonly known as: MOBIC   ondansetron 4 MG tablet Commonly known as: ZOFRAN   sulfamethoxazole-trimethoprim 800-160 MG tablet Commonly known as: BACTRIM DS       TAKE these medications    acetaminophen  325 MG tablet Commonly known as: TYLENOL  Take 650 mg by mouth every 6 (six) hours as needed for mild pain (pain score 1-3).   aspirin  81 MG tablet Take 81 mg by mouth daily.   docusate sodium  100 MG capsule Commonly known as: COLACE Take 1 capsule (100 mg total) by mouth 2 (two) times daily as needed for mild constipation.   losartan  25 MG tablet Commonly known as: COZAAR  Take 0.5 tablets (12.5 mg total) by mouth daily. What changed: how much to take   metFORMIN 500 MG tablet Commonly known as: GLUCOPHAGE Take 2 tablets by mouth every evening.   polyethylene glycol 17 g packet Commonly known as: MIRALAX  / GLYCOLAX  Take 17 g by mouth daily as needed for moderate constipation.   Toujeo  SoloStar 300 UNIT/ML Solostar Pen Generic drug: insulin  glargine (1 Unit Dial) Inject 12 Units into the skin daily.        Follow-up Information     [facility] Follow up.   Contact information: [address] [place] [address] [phone]  Time spent: 35 Minutes.  SignedDoroteo Gasmen 11/14/2023, 5:27 AM

## 2023-11-15 DIAGNOSIS — E1122 Type 2 diabetes mellitus with diabetic chronic kidney disease: Secondary | ICD-10-CM | POA: Diagnosis not present

## 2023-11-15 DIAGNOSIS — Z794 Long term (current) use of insulin: Secondary | ICD-10-CM | POA: Diagnosis not present

## 2023-11-15 DIAGNOSIS — B3781 Candidal esophagitis: Secondary | ICD-10-CM | POA: Diagnosis not present

## 2023-11-15 DIAGNOSIS — N183 Chronic kidney disease, stage 3 unspecified: Secondary | ICD-10-CM | POA: Diagnosis not present

## 2023-11-15 DIAGNOSIS — E876 Hypokalemia: Secondary | ICD-10-CM | POA: Diagnosis not present

## 2023-11-15 DIAGNOSIS — E871 Hypo-osmolality and hyponatremia: Secondary | ICD-10-CM | POA: Diagnosis not present

## 2023-11-15 DIAGNOSIS — E1136 Type 2 diabetes mellitus with diabetic cataract: Secondary | ICD-10-CM | POA: Diagnosis not present

## 2023-11-15 DIAGNOSIS — I129 Hypertensive chronic kidney disease with stage 1 through stage 4 chronic kidney disease, or unspecified chronic kidney disease: Secondary | ICD-10-CM | POA: Diagnosis not present

## 2023-11-15 DIAGNOSIS — N3000 Acute cystitis without hematuria: Secondary | ICD-10-CM | POA: Diagnosis not present

## 2023-11-20 DIAGNOSIS — G720 Drug-induced myopathy: Secondary | ICD-10-CM | POA: Diagnosis not present

## 2023-11-20 DIAGNOSIS — Z794 Long term (current) use of insulin: Secondary | ICD-10-CM | POA: Diagnosis not present

## 2023-11-20 DIAGNOSIS — E1122 Type 2 diabetes mellitus with diabetic chronic kidney disease: Secondary | ICD-10-CM | POA: Diagnosis not present

## 2023-11-20 DIAGNOSIS — I129 Hypertensive chronic kidney disease with stage 1 through stage 4 chronic kidney disease, or unspecified chronic kidney disease: Secondary | ICD-10-CM | POA: Diagnosis not present

## 2023-11-20 DIAGNOSIS — G609 Hereditary and idiopathic neuropathy, unspecified: Secondary | ICD-10-CM | POA: Diagnosis not present

## 2023-11-20 DIAGNOSIS — M47812 Spondylosis without myelopathy or radiculopathy, cervical region: Secondary | ICD-10-CM | POA: Diagnosis not present

## 2023-11-20 DIAGNOSIS — N183 Chronic kidney disease, stage 3 unspecified: Secondary | ICD-10-CM | POA: Diagnosis not present

## 2023-11-20 DIAGNOSIS — E1136 Type 2 diabetes mellitus with diabetic cataract: Secondary | ICD-10-CM | POA: Diagnosis not present

## 2023-11-20 DIAGNOSIS — M159 Polyosteoarthritis, unspecified: Secondary | ICD-10-CM | POA: Diagnosis not present

## 2023-12-02 DIAGNOSIS — G720 Drug-induced myopathy: Secondary | ICD-10-CM | POA: Diagnosis not present

## 2023-12-02 DIAGNOSIS — G609 Hereditary and idiopathic neuropathy, unspecified: Secondary | ICD-10-CM | POA: Diagnosis not present

## 2023-12-02 DIAGNOSIS — N183 Chronic kidney disease, stage 3 unspecified: Secondary | ICD-10-CM | POA: Diagnosis not present

## 2023-12-02 DIAGNOSIS — M159 Polyosteoarthritis, unspecified: Secondary | ICD-10-CM | POA: Diagnosis not present

## 2023-12-02 DIAGNOSIS — M47812 Spondylosis without myelopathy or radiculopathy, cervical region: Secondary | ICD-10-CM | POA: Diagnosis not present

## 2023-12-02 DIAGNOSIS — Z794 Long term (current) use of insulin: Secondary | ICD-10-CM | POA: Diagnosis not present

## 2023-12-02 DIAGNOSIS — E1136 Type 2 diabetes mellitus with diabetic cataract: Secondary | ICD-10-CM | POA: Diagnosis not present

## 2023-12-02 DIAGNOSIS — E1122 Type 2 diabetes mellitus with diabetic chronic kidney disease: Secondary | ICD-10-CM | POA: Diagnosis not present

## 2023-12-02 DIAGNOSIS — I129 Hypertensive chronic kidney disease with stage 1 through stage 4 chronic kidney disease, or unspecified chronic kidney disease: Secondary | ICD-10-CM | POA: Diagnosis not present

## 2023-12-09 DIAGNOSIS — R2681 Unsteadiness on feet: Secondary | ICD-10-CM | POA: Diagnosis not present

## 2023-12-09 DIAGNOSIS — R296 Repeated falls: Secondary | ICD-10-CM | POA: Diagnosis not present

## 2023-12-16 DIAGNOSIS — Z794 Long term (current) use of insulin: Secondary | ICD-10-CM | POA: Diagnosis not present

## 2023-12-16 DIAGNOSIS — N183 Chronic kidney disease, stage 3 unspecified: Secondary | ICD-10-CM | POA: Diagnosis not present

## 2023-12-16 DIAGNOSIS — M159 Polyosteoarthritis, unspecified: Secondary | ICD-10-CM | POA: Diagnosis not present

## 2023-12-16 DIAGNOSIS — M47812 Spondylosis without myelopathy or radiculopathy, cervical region: Secondary | ICD-10-CM | POA: Diagnosis not present

## 2023-12-16 DIAGNOSIS — G609 Hereditary and idiopathic neuropathy, unspecified: Secondary | ICD-10-CM | POA: Diagnosis not present

## 2023-12-16 DIAGNOSIS — E1122 Type 2 diabetes mellitus with diabetic chronic kidney disease: Secondary | ICD-10-CM | POA: Diagnosis not present

## 2023-12-16 DIAGNOSIS — G720 Drug-induced myopathy: Secondary | ICD-10-CM | POA: Diagnosis not present

## 2023-12-16 DIAGNOSIS — E1136 Type 2 diabetes mellitus with diabetic cataract: Secondary | ICD-10-CM | POA: Diagnosis not present

## 2023-12-16 DIAGNOSIS — I129 Hypertensive chronic kidney disease with stage 1 through stage 4 chronic kidney disease, or unspecified chronic kidney disease: Secondary | ICD-10-CM | POA: Diagnosis not present

## 2023-12-17 DIAGNOSIS — I129 Hypertensive chronic kidney disease with stage 1 through stage 4 chronic kidney disease, or unspecified chronic kidney disease: Secondary | ICD-10-CM | POA: Diagnosis not present

## 2023-12-17 DIAGNOSIS — G609 Hereditary and idiopathic neuropathy, unspecified: Secondary | ICD-10-CM | POA: Diagnosis not present

## 2023-12-17 DIAGNOSIS — E1122 Type 2 diabetes mellitus with diabetic chronic kidney disease: Secondary | ICD-10-CM | POA: Diagnosis not present

## 2023-12-17 DIAGNOSIS — N183 Chronic kidney disease, stage 3 unspecified: Secondary | ICD-10-CM | POA: Diagnosis not present

## 2023-12-18 DIAGNOSIS — I129 Hypertensive chronic kidney disease with stage 1 through stage 4 chronic kidney disease, or unspecified chronic kidney disease: Secondary | ICD-10-CM | POA: Diagnosis not present

## 2023-12-18 DIAGNOSIS — G720 Drug-induced myopathy: Secondary | ICD-10-CM | POA: Diagnosis not present

## 2023-12-18 DIAGNOSIS — E1122 Type 2 diabetes mellitus with diabetic chronic kidney disease: Secondary | ICD-10-CM | POA: Diagnosis not present

## 2023-12-18 DIAGNOSIS — Z794 Long term (current) use of insulin: Secondary | ICD-10-CM | POA: Diagnosis not present

## 2023-12-18 DIAGNOSIS — E782 Mixed hyperlipidemia: Secondary | ICD-10-CM | POA: Diagnosis not present

## 2023-12-18 DIAGNOSIS — N182 Chronic kidney disease, stage 2 (mild): Secondary | ICD-10-CM | POA: Diagnosis not present

## 2023-12-18 DIAGNOSIS — G609 Hereditary and idiopathic neuropathy, unspecified: Secondary | ICD-10-CM | POA: Diagnosis not present

## 2023-12-25 DIAGNOSIS — G720 Drug-induced myopathy: Secondary | ICD-10-CM | POA: Diagnosis not present

## 2023-12-25 DIAGNOSIS — E782 Mixed hyperlipidemia: Secondary | ICD-10-CM | POA: Diagnosis not present

## 2023-12-25 DIAGNOSIS — G609 Hereditary and idiopathic neuropathy, unspecified: Secondary | ICD-10-CM | POA: Diagnosis not present

## 2023-12-25 DIAGNOSIS — E1122 Type 2 diabetes mellitus with diabetic chronic kidney disease: Secondary | ICD-10-CM | POA: Diagnosis not present

## 2023-12-25 DIAGNOSIS — I129 Hypertensive chronic kidney disease with stage 1 through stage 4 chronic kidney disease, or unspecified chronic kidney disease: Secondary | ICD-10-CM | POA: Diagnosis not present

## 2023-12-25 DIAGNOSIS — Z794 Long term (current) use of insulin: Secondary | ICD-10-CM | POA: Diagnosis not present

## 2023-12-25 DIAGNOSIS — E871 Hypo-osmolality and hyponatremia: Secondary | ICD-10-CM | POA: Diagnosis not present

## 2023-12-25 DIAGNOSIS — R300 Dysuria: Secondary | ICD-10-CM | POA: Diagnosis not present

## 2023-12-25 DIAGNOSIS — N182 Chronic kidney disease, stage 2 (mild): Secondary | ICD-10-CM | POA: Diagnosis not present

## 2023-12-30 DIAGNOSIS — N390 Urinary tract infection, site not specified: Secondary | ICD-10-CM | POA: Diagnosis not present

## 2023-12-30 DIAGNOSIS — R32 Unspecified urinary incontinence: Secondary | ICD-10-CM | POA: Diagnosis not present

## 2023-12-30 DIAGNOSIS — Z01419 Encounter for gynecological examination (general) (routine) without abnormal findings: Secondary | ICD-10-CM | POA: Diagnosis not present

## 2023-12-30 DIAGNOSIS — N952 Postmenopausal atrophic vaginitis: Secondary | ICD-10-CM | POA: Diagnosis not present

## 2024-01-30 DIAGNOSIS — R300 Dysuria: Secondary | ICD-10-CM | POA: Diagnosis not present

## 2024-04-20 DIAGNOSIS — G720 Drug-induced myopathy: Secondary | ICD-10-CM | POA: Diagnosis not present

## 2024-04-20 DIAGNOSIS — Z794 Long term (current) use of insulin: Secondary | ICD-10-CM | POA: Diagnosis not present

## 2024-04-20 DIAGNOSIS — N182 Chronic kidney disease, stage 2 (mild): Secondary | ICD-10-CM | POA: Diagnosis not present

## 2024-04-20 DIAGNOSIS — E1122 Type 2 diabetes mellitus with diabetic chronic kidney disease: Secondary | ICD-10-CM | POA: Diagnosis not present

## 2024-04-20 DIAGNOSIS — I129 Hypertensive chronic kidney disease with stage 1 through stage 4 chronic kidney disease, or unspecified chronic kidney disease: Secondary | ICD-10-CM | POA: Diagnosis not present

## 2024-04-20 DIAGNOSIS — G609 Hereditary and idiopathic neuropathy, unspecified: Secondary | ICD-10-CM | POA: Diagnosis not present

## 2024-04-20 DIAGNOSIS — E782 Mixed hyperlipidemia: Secondary | ICD-10-CM | POA: Diagnosis not present

## 2024-04-20 DIAGNOSIS — R5383 Other fatigue: Secondary | ICD-10-CM | POA: Diagnosis not present

## 2024-04-20 DIAGNOSIS — E871 Hypo-osmolality and hyponatremia: Secondary | ICD-10-CM | POA: Diagnosis not present

## 2024-04-27 DIAGNOSIS — G609 Hereditary and idiopathic neuropathy, unspecified: Secondary | ICD-10-CM | POA: Diagnosis not present

## 2024-04-27 DIAGNOSIS — N1831 Chronic kidney disease, stage 3a: Secondary | ICD-10-CM | POA: Diagnosis not present

## 2024-04-27 DIAGNOSIS — E1122 Type 2 diabetes mellitus with diabetic chronic kidney disease: Secondary | ICD-10-CM | POA: Diagnosis not present

## 2024-04-27 DIAGNOSIS — Z0000 Encounter for general adult medical examination without abnormal findings: Secondary | ICD-10-CM | POA: Diagnosis not present

## 2024-04-27 DIAGNOSIS — Z794 Long term (current) use of insulin: Secondary | ICD-10-CM | POA: Diagnosis not present

## 2024-04-27 DIAGNOSIS — E782 Mixed hyperlipidemia: Secondary | ICD-10-CM | POA: Diagnosis not present

## 2024-04-27 DIAGNOSIS — I129 Hypertensive chronic kidney disease with stage 1 through stage 4 chronic kidney disease, or unspecified chronic kidney disease: Secondary | ICD-10-CM | POA: Diagnosis not present

## 2024-04-27 DIAGNOSIS — G720 Drug-induced myopathy: Secondary | ICD-10-CM | POA: Diagnosis not present

## 2024-04-27 DIAGNOSIS — N390 Urinary tract infection, site not specified: Secondary | ICD-10-CM | POA: Diagnosis not present

## 2024-05-05 DIAGNOSIS — R32 Unspecified urinary incontinence: Secondary | ICD-10-CM | POA: Diagnosis not present

## 2024-05-05 DIAGNOSIS — Z794 Long term (current) use of insulin: Secondary | ICD-10-CM | POA: Diagnosis not present

## 2024-05-05 DIAGNOSIS — E1136 Type 2 diabetes mellitus with diabetic cataract: Secondary | ICD-10-CM | POA: Diagnosis not present

## 2024-05-05 DIAGNOSIS — E1140 Type 2 diabetes mellitus with diabetic neuropathy, unspecified: Secondary | ICD-10-CM | POA: Diagnosis not present

## 2024-05-05 DIAGNOSIS — E1122 Type 2 diabetes mellitus with diabetic chronic kidney disease: Secondary | ICD-10-CM | POA: Diagnosis not present

## 2024-05-05 DIAGNOSIS — M1990 Unspecified osteoarthritis, unspecified site: Secondary | ICD-10-CM | POA: Diagnosis not present

## 2024-05-05 DIAGNOSIS — N1831 Chronic kidney disease, stage 3a: Secondary | ICD-10-CM | POA: Diagnosis not present

## 2024-05-05 DIAGNOSIS — I129 Hypertensive chronic kidney disease with stage 1 through stage 4 chronic kidney disease, or unspecified chronic kidney disease: Secondary | ICD-10-CM | POA: Diagnosis not present

## 2024-05-05 DIAGNOSIS — Z7982 Long term (current) use of aspirin: Secondary | ICD-10-CM | POA: Diagnosis not present

## 2024-05-11 DIAGNOSIS — N390 Urinary tract infection, site not specified: Secondary | ICD-10-CM | POA: Diagnosis not present

## 2024-07-05 ENCOUNTER — Ambulatory Visit
Admission: EM | Admit: 2024-07-05 | Discharge: 2024-07-05 | Disposition: A | Discharge status: Home / Self Care | Attending: Physician Assistant | Admitting: Physician Assistant

## 2024-07-05 ENCOUNTER — Encounter: Payer: Self-pay | Admitting: Emergency Medicine

## 2024-07-05 DIAGNOSIS — J209 Acute bronchitis, unspecified: Secondary | ICD-10-CM

## 2024-07-05 DIAGNOSIS — R058 Other specified cough: Secondary | ICD-10-CM

## 2024-07-05 MED ORDER — BENZONATATE 100 MG PO CAPS: 100.0000 mg | ORAL_CAPSULE | Freq: Three times a day (TID) | ORAL | 0 refills | Status: AC

## 2024-07-05 MED ORDER — AZITHROMYCIN 250 MG PO TABS: ORAL_TABLET | ORAL | 0 refills | Status: AC

## 2024-07-05 NOTE — ED Provider Notes (Signed)
 " [facility]    CSN: [id number] Arrival date & time: 07/05/24  1120      History   Chief Complaint Chief Complaint  Patient presents with   Cough    HPI Dominique Simmons is a 89 y.o. female.   HPI Pt is here today with concerns for a dry cough that has been present for about a month and over the last week has become productive with yellow phlegm.  She reports a sore throat just started yesterday.  She denies SOB, wheezing or DOE.  She reports some diarrhea, decreased appetite and feeling sick to her stomach along with fatigue.   Interventions: regular mucinex       Past Medical History:  Diagnosis Date   Arthritis    Cataract    BEGINING   Diabetes mellitus without complication (HCC)    pt d/c Metformin August 2021 due to esophageal ulcers   Esophageal ring 11/2011   GERD (gastroesophageal reflux disease)    Hyperlipidemia    Hypertension    IBS (irritable bowel syndrome)    Sliding hiatal hernia 11/2011    Patient Active Problem List   Diagnosis Date Noted   Hyponatremia 09/16/2023   Hypokalemia 09/16/2023   Slurred speech 09/16/2023   Generalized weakness 09/16/2023   Essential hypertension 09/16/2023   Insulin  dependent type 2 diabetes mellitus (HCC) 09/16/2023   Acute cystitis 09/15/2023   Loss of weight 01/25/2020   Dysphagia 01/25/2020   Onychomycosis 03/19/2017   Plantar fasciitis, right 03/19/2017    Past Surgical History:  Procedure Laterality Date   COLONOSCOPY     KNEE ARTHROSCOPY  11/2006   right   ROTATOR CUFF REPAIR     left   TONSILLECTOMY     TOTAL ABDOMINAL HYSTERECTOMY  1980   UPPER GASTROINTESTINAL ENDOSCOPY     VEIN LIGATION AND STRIPPING  2003   bilateral   WRIST SURGERY     left    OB History   No obstetric history on file.      Home Medications    Prior to Admission medications  Medication Sig Start Date End Date Taking? Authorizing Provider  azithromycin  (ZITHROMAX ) 250 MG tablet Take 500mg  PO daily  x1d and then 250mg  daily x4 days 07/05/24  Yes Denae Zulueta E, PA-C  benzonatate  (TESSALON ) 100 MG capsule Take 1 capsule (100 mg total) by mouth every 8 (eight) hours. 07/05/24  Yes Jalyric Kaestner E, PA-C  acetaminophen  (TYLENOL ) 325 MG tablet Take 650 mg by mouth every 6 (six) hours as needed for mild pain (pain score 1-3).    [provider]  aspirin  81 MG tablet Take 81 mg by mouth daily.    [provider]  docusate sodium  (COLACE) 100 MG capsule Take 1 capsule (100 mg total) by mouth 2 (two) times daily as needed for mild constipation. 10/06/23   Rosario Leatrice FERNS, MD  losartan  (COZAAR ) 25 MG tablet Take 0.5 tablets (12.5 mg total) by mouth daily. 10/06/23   Rosario Leatrice I, MD  metFORMIN (GLUCOPHAGE) 500 MG tablet Take 2 tablets by mouth every evening.    [provider]  polyethylene glycol (MIRALAX  / GLYCOLAX ) 17 g packet Take 17 g by mouth daily as needed for moderate constipation. 10/06/23   Rosario Leatrice FERNS, MD  TOUJEO  SOLOSTAR 300 UNIT/ML Solostar Pen Inject 12 Units into the skin daily.    [provider]    Family History Family History  Problem Relation Age of Onset   Coronary  artery disease Mother    Heart disease Mother    Coronary artery disease Father    Heart disease Father    Coronary artery disease Sister    Heart attack Sister    Diabetes Brother    Colon cancer Neg Hx    Esophageal cancer Neg Hx    Rectal cancer Neg Hx    Stomach cancer Neg Hx     Social History Social History[1]   Allergies   Amoxicillin, Lescol [fluvastatin sodium], Lipitor [atorvastatin], Lisinopril, and Pravachol [pravastatin sodium]   Review of Systems Review of Systems  Constitutional:  Negative for chills and fever.  HENT:  Positive for congestion and sore throat. Negative for ear pain and postnasal drip.   Respiratory:  Positive for cough. Negative for shortness of breath and wheezing.   Gastrointestinal:  Positive for diarrhea. Negative for  nausea and vomiting.     Physical Exam Triage Vital Signs ED Triage Vitals  Encounter Vitals Group     BP 07/05/24 1125 138/68     Girls Systolic BP Percentile --      Girls Diastolic BP Percentile --      Boys Systolic BP Percentile --      Boys Diastolic BP Percentile --      Pulse Rate 07/05/24 1125 84     Resp 07/05/24 1125 14     Temp 07/05/24 1125 97.7 F (36.5 C)     Temp Source 07/05/24 1125 Temporal     SpO2 07/05/24 1125 97 %     Weight --      Height --      Head Circumference --      Peak Flow --      Pain Score 07/05/24 1131 0     Pain Loc --      Pain Education --      Exclude from Growth Chart --    No data found.  Updated Vital Signs BP 138/68 (BP Location: Right Arm)   Pulse 84   Temp 97.7 F (36.5 C) (Temporal)   Resp 14   SpO2 97%   Visual Acuity Right Eye Distance:   Left Eye Distance:   Bilateral Distance:    Right Eye Near:   Left Eye Near:    Bilateral Near:     Physical Exam Vitals reviewed.  Constitutional:      General: She is awake.     Appearance: Normal appearance. She is well-developed and well-groomed.  HENT:     Head: Normocephalic and atraumatic.     Right Ear: Hearing, tympanic membrane and ear canal normal.     Left Ear: Hearing, tympanic membrane and ear canal normal.     Mouth/Throat:     Lips: Pink.     Mouth: Mucous membranes are moist.     Pharynx: Oropharynx is clear. Uvula midline. No pharyngeal swelling, oropharyngeal exudate, posterior oropharyngeal erythema, uvula swelling or postnasal drip.  Cardiovascular:     Rate and Rhythm: Normal rate and regular rhythm.     Pulses: Normal pulses.          Radial pulses are 2+ on the right side and 2+ on the left side.     Heart sounds: Normal heart sounds. No murmur heard.    No friction rub. No gallop.  Pulmonary:     Effort: Pulmonary effort is normal.     Breath sounds: Normal breath sounds. No decreased air movement. No decreased breath sounds, wheezing,  rhonchi or rales.  Comments: Lungs sounds are vesicular but chest sounds slightly tight.   Musculoskeletal:     Cervical back: Normal range of motion and neck supple.  Lymphadenopathy:     Head:     Right side of head: No submental, submandibular or preauricular adenopathy.     Left side of head: No submental, submandibular or preauricular adenopathy.     Cervical:     Right cervical: No superficial cervical adenopathy.    Left cervical: No superficial cervical adenopathy.     Upper Body:     Right upper body: No supraclavicular adenopathy.     Left upper body: No supraclavicular adenopathy.  Neurological:     Mental Status: She is alert.  Psychiatric:        Mood and Affect: Mood normal.        Behavior: Behavior normal. Behavior is cooperative.        Thought Content: Thought content normal.        Judgment: Judgment normal.      UC Treatments / Results  Labs (all labs ordered are listed, but only abnormal results are displayed) Labs Reviewed - No data to display  EKG   Radiology No results found.  Procedures Procedures (including critical care time)  Medications Ordered in UC Medications - No data to display  Initial Impression / Assessment and Plan / UC Course  I have reviewed the triage vital signs and the nursing notes.  Pertinent labs & imaging results that were available during my care of the patient were reviewed by me and considered in my medical decision making (see chart for details).      Final Clinical Impressions(s) / UC Diagnoses   Final diagnoses:  Productive cough  Acute bronchitis, unspecified organism   Patient is here today with concerns of a dry cough that has been present for about a month and over the last week became productive of yellow mucus.  She also reports nasal congestion and, diarrhea and decreased appetite.  Vitals are largely reassuring and physical exam is as well.  Lung sounds are vesicular but it does sound like  patient's chest is slightly tight.  Since symptoms have been ongoing for so long we will send patient home with a Z-Pak and Tessalon  Perles to assist with management.  Reviewed with patient that the Z-Pak should help with pulmonary inflammation as well as provide antibiotic coverage.  Recommend that she continues OTC medications with appropriate formulations for someone with HTN.  ED and return precautions reviewed and provided in AVS.  Follow-up as needed.    Discharge Instructions      You were seen today for concerns of a cough that has been present for about a month and recently became productive.  At this time I am concerned you may have bronchitis. I sent in an antibiotic called azithromycin , also known as a Z-Pak for you to take to assist with reducing pulmonary inflammation as well as provide antibiotic coverage.  To help with your cough I am sending in something called Tessalon  Perles that you can use up to every 8 hours as needed.  This medication helps reduce the coughing reflex and sometimes helps reduce the amount of coughing that is present. You can continue to take Mucinex , regular formulation, as desired to help with the congestion and mucus production.  I also recommend adding Flonase nasal spray to further assist with your nasal symptoms.  If you feel like your symptoms are not improving or seem to be  worsening over the next 3 to 5 days I recommend following up here or with your primary care provider for further evaluation.     ED Prescriptions     Medication Sig Dispense Auth. Provider   benzonatate  (TESSALON ) 100 MG capsule Take 1 capsule (100 mg total) by mouth every 8 (eight) hours. 21 capsule Aiyana Stegmann E, PA-C   azithromycin  (ZITHROMAX ) 250 MG tablet Take 500mg  PO daily x1d and then 250mg  daily x4 days 6 each Isebella Upshur E, PA-C      PDMP not reviewed this encounter.     [1]  Social History Tobacco Use   Smoking status: Never   Smokeless tobacco: Never  Vaping  Use   Vaping status: Never Used  Substance Use Topics   Alcohol use: No   Drug use: No     Almina Schul, Rocky BRAVO, PA-C 07/05/24 1208  "

## 2024-07-05 NOTE — Discharge Instructions (Addendum)
 You were seen today for concerns of a cough that has been present for about a month and recently became productive.  At this time I am concerned you may have bronchitis. I sent in an antibiotic called azithromycin , also known as a Z-Pak for you to take to assist with reducing pulmonary inflammation as well as provide antibiotic coverage.  To help with your cough I am sending in something called Tessalon  Perles that you can use up to every 8 hours as needed.  This medication helps reduce the coughing reflex and sometimes helps reduce the amount of coughing that is present. You can continue to take Mucinex , regular formulation, as desired to help with the congestion and mucus production.  I also recommend adding Flonase nasal spray to further assist with your nasal symptoms.  If you feel like your symptoms are not improving or seem to be worsening over the next 3 to 5 days I recommend following up here or with your primary care provider for further evaluation.

## 2024-07-05 NOTE — ED Triage Notes (Signed)
 Pt c/o cough and congestion for over 1 month.
